# Patient Record
Sex: Female | Born: 1985 | Race: White | Hispanic: No | Marital: Married | Smoking: Former smoker
Health system: Southern US, Community
[De-identification: ages and names within clinical notes are randomized; demographics above are authoritative.]

## PROBLEM LIST (undated history)

## (undated) DIAGNOSIS — Z789 Other specified health status: Secondary | ICD-10-CM

## (undated) DIAGNOSIS — N3010 Interstitial cystitis (chronic) without hematuria: Secondary | ICD-10-CM

## (undated) DIAGNOSIS — I7300 Raynaud's syndrome without gangrene: Secondary | ICD-10-CM

## (undated) DIAGNOSIS — O4693 Antepartum hemorrhage, unspecified, third trimester: Secondary | ICD-10-CM

## (undated) DIAGNOSIS — S83249A Other tear of medial meniscus, current injury, unspecified knee, initial encounter: Secondary | ICD-10-CM

## (undated) DIAGNOSIS — Z98811 Dental restoration status: Secondary | ICD-10-CM

## (undated) HISTORY — DX: Raynaud's syndrome without gangrene: I73.00

## (undated) HISTORY — PX: INGUINAL HERNIA REPAIR: SHX194

## (undated) HISTORY — PX: ARTHROSCOPIC REPAIR ACL: SUR80

## (undated) HISTORY — PX: WISDOM TOOTH EXTRACTION: SHX21

## (undated) HISTORY — PX: BREAST ENHANCEMENT SURGERY: SHX7

---

## 2004-12-05 ENCOUNTER — Other Ambulatory Visit: Admission: RE | Admit: 2004-12-05 | Discharge: 2004-12-05 | Payer: Self-pay | Admitting: Obstetrics and Gynecology

## 2005-03-19 ENCOUNTER — Emergency Department (HOSPITAL_COMMUNITY): Admission: EM | Admit: 2005-03-19 | Discharge: 2005-03-19 | Payer: Self-pay | Admitting: Emergency Medicine

## 2005-04-22 ENCOUNTER — Other Ambulatory Visit: Admission: RE | Admit: 2005-04-22 | Discharge: 2005-04-22 | Payer: Self-pay | Admitting: Obstetrics and Gynecology

## 2008-05-31 ENCOUNTER — Ambulatory Visit: Payer: Self-pay | Admitting: Vascular Surgery

## 2008-07-19 ENCOUNTER — Ambulatory Visit: Payer: Self-pay | Admitting: Vascular Surgery

## 2009-06-13 ENCOUNTER — Ambulatory Visit: Payer: Self-pay | Admitting: Vascular Surgery

## 2010-04-03 ENCOUNTER — Ambulatory Visit
Admission: RE | Admit: 2010-04-03 | Discharge: 2010-04-03 | Payer: Self-pay | Source: Home / Self Care | Attending: Vascular Surgery | Admitting: Vascular Surgery

## 2010-05-15 ENCOUNTER — Ambulatory Visit (HOSPITAL_BASED_OUTPATIENT_CLINIC_OR_DEPARTMENT_OTHER)
Admission: RE | Admit: 2010-05-15 | Discharge: 2010-05-15 | Disposition: A | Discharge status: Home / Self Care | Payer: 59 | Attending: Urology | Admitting: Urology

## 2010-05-15 DIAGNOSIS — Z01812 Encounter for preprocedural laboratory examination: Secondary | ICD-10-CM | POA: Insufficient documentation

## 2010-05-15 DIAGNOSIS — N3010 Interstitial cystitis (chronic) without hematuria: Secondary | ICD-10-CM | POA: Insufficient documentation

## 2010-05-15 HISTORY — PX: CYSTOSCOPY WITH HYDRODISTENSION AND BIOPSY: SHX5127

## 2010-05-15 LAB — POCT PREGNANCY, URINE: Preg Test, Ur: NEGATIVE

## 2010-06-05 NOTE — Op Note (Signed)
  NAME:  Peggy Sanchez, Peggy Sanchez              ACCOUNT NO.:  [id number]  MEDICAL RECORD NO.:  [id number]          PATIENT TYPE:  AMB  LOCATION:  [facility]                         FACILITY:  [facility]  PHYSICIAN:  Martina Sinner, MD DATE OF BIRTH:  Nov 08, 1985  DATE OF PROCEDURE:  05/15/2010 DATE OF DISCHARGE:                              OPERATIVE REPORT   PREOPERATIVE DIAGNOSIS:  Pelvic pain.  POSTOPERATIVE DIAGNOSIS:  Interstitial cystitis.  PROCEDURE:  Cystoscopy, bladder hydrodistention, bladder installation therapy.  PROCEDURE IN DETAIL:  The patient was prepped and draped in the usual fashion.  A 21-French scope was utilized.  Bladder mucosa and trigone were normal.  There was no stitch, foreign body or carcinoma.  She was hydrodistended to 800 mL.  The bladder was emptied.  On reinspection she had mild diffuse glomerulations, especially at 5 o'clock and 7 o'clock. The bladder was emptied.  As a separate procedure, I instilled 15 cc of 0.5% Marcaine plus 400 mg of Pyridium.  The patient was taken to the recovery room.          ______________________________ Martina Sinner, MD     SAM/MEDQ  D:  05/15/2010  T:  05/15/2010  Job:  [id number]  Electronically Signed by Alfredo Martinez MD on 06/05/2010 12:49:34 PM

## 2010-06-12 ENCOUNTER — Ambulatory Visit (INDEPENDENT_AMBULATORY_CARE_PROVIDER_SITE_OTHER): Payer: Self-pay

## 2010-06-12 DIAGNOSIS — I83893 Varicose veins of bilateral lower extremities with other complications: Secondary | ICD-10-CM

## 2010-07-10 ENCOUNTER — Ambulatory Visit: Payer: 59

## 2010-12-16 ENCOUNTER — Encounter: Payer: Self-pay | Admitting: *Deleted

## 2010-12-24 ENCOUNTER — Ambulatory Visit (INDEPENDENT_AMBULATORY_CARE_PROVIDER_SITE_OTHER): Payer: 59 | Admitting: *Deleted

## 2010-12-24 DIAGNOSIS — I781 Nevus, non-neoplastic: Secondary | ICD-10-CM

## 2010-12-24 NOTE — Progress Notes (Signed)
Subjective:     Patient ID: Peggy Sanchez, female   DOB: September 24, 1985, 25 y.o.   MRN: [id number]  HPI X=.3% Sotradecol administered with a 27g butterfly.  Patient received a total of 3cc foam.       Photos: yes, archived  Compression stockings applied: yes    Review of Systems     Objective:   Physical Exam     Assessment:     Her spider veins are TINY but I was able to get into them. Did not have to use an entire syringe. She tolerated the tx very well    Plan:     Follow prn.

## 2011-02-25 ENCOUNTER — Ambulatory Visit (INDEPENDENT_AMBULATORY_CARE_PROVIDER_SITE_OTHER): Payer: 59 | Admitting: *Deleted

## 2011-02-25 DIAGNOSIS — I781 Nevus, non-neoplastic: Secondary | ICD-10-CM

## 2011-03-02 ENCOUNTER — Encounter: Payer: Self-pay | Admitting: Vascular Surgery

## 2011-10-23 LAB — OB RESULTS CONSOLE HIV ANTIBODY (ROUTINE TESTING): HIV: NONREACTIVE

## 2011-10-23 LAB — OB RESULTS CONSOLE ABO/RH: RH Type: NEGATIVE

## 2011-10-23 LAB — OB RESULTS CONSOLE HEPATITIS B SURFACE ANTIGEN: Hepatitis B Surface Ag: NEGATIVE

## 2011-10-23 LAB — OB RESULTS CONSOLE RUBELLA ANTIBODY, IGM: Rubella: IMMUNE

## 2011-10-23 LAB — OB RESULTS CONSOLE RPR: RPR: NONREACTIVE

## 2011-11-05 LAB — OB RESULTS CONSOLE GC/CHLAMYDIA
Chlamydia: NEGATIVE
Gonorrhea: NEGATIVE

## 2012-03-23 NOTE — L&D Delivery Note (Signed)
Delivery Note At 9:57 AM a viable female was delivered via Vaginal, Spontaneous Delivery (Presentation: Left Occiput Anterior).  APGAR: 9, 9; weight  Loose Nuchal Cord x 1 reduced prior to delivery of shoulders and body.  Placenta status: Intact, Spontaneous.  Cord pH: No collected. Cord blood as other Rh neg status. Delivery with out complications.  Anesthesia: Epidural  Episiotomy: None Lacerations: Labial;Vaginal;1st degree, Minor laceration - repair for good alignment Suture Repair: 4.0 vicryl Est. Blood Loss (mL): 300  Mom to postpartum.  Baby to nursery-stable.  DAVIES, DENISE 05/26/2012, 10:44 AM

## 2012-05-26 ENCOUNTER — Encounter (HOSPITAL_COMMUNITY): Payer: Self-pay | Admitting: *Deleted

## 2012-05-26 ENCOUNTER — Inpatient Hospital Stay (HOSPITAL_COMMUNITY): Payer: BC Managed Care – PPO | Admitting: Anesthesiology

## 2012-05-26 ENCOUNTER — Encounter (HOSPITAL_COMMUNITY): Payer: Self-pay | Admitting: Anesthesiology

## 2012-05-26 ENCOUNTER — Inpatient Hospital Stay (HOSPITAL_COMMUNITY)
Admission: AD | Admit: 2012-05-26 | Discharge: 2012-05-28 | DRG: 373 | Disposition: A | Discharge status: Home / Self Care | Payer: BC Managed Care – PPO | Source: Ambulatory Visit | Attending: Obstetrics and Gynecology | Admitting: Obstetrics and Gynecology

## 2012-05-26 LAB — CBC
HCT: 38.9 % (ref 36.0–46.0)
Hemoglobin: 13.5 g/dL (ref 12.0–15.0)
MCH: 31.6 pg (ref 26.0–34.0)
MCHC: 34.7 g/dL (ref 30.0–36.0)
MCV: 91.1 fL (ref 78.0–100.0)
Platelets: 230 10*3/uL (ref 150–400)
RBC: 4.27 MIL/uL (ref 3.87–5.11)
RDW: 12.9 % (ref 11.5–15.5)
WBC: 12.5 10*3/uL — ABNORMAL HIGH (ref 4.0–10.5)

## 2012-05-26 LAB — RPR: RPR Ser Ql: NONREACTIVE

## 2012-05-26 LAB — AMNISURE RUPTURE OF MEMBRANE (ROM) NOT AT ARMC: Amnisure ROM: POSITIVE

## 2012-05-26 MED ORDER — PHENYLEPHRINE 40 MCG/ML (10ML) SYRINGE FOR IV PUSH (FOR BLOOD PRESSURE SUPPORT): 80.0000 ug | PREFILLED_SYRINGE | INTRAVENOUS | Status: DC | PRN

## 2012-05-26 MED ORDER — BENZOCAINE-MENTHOL 20-0.5 % EX AERO
1.0000 "application " | INHALATION_SPRAY | CUTANEOUS | Status: DC | PRN
  Administered 2012-05-26: 1 via TOPICAL
  Filled 2012-05-26: qty 56

## 2012-05-26 MED ORDER — DIBUCAINE 1 % RE OINT: 1.0000 "application " | TOPICAL_OINTMENT | RECTAL | Status: DC | PRN

## 2012-05-26 MED ORDER — LANOLIN HYDROUS EX OINT
TOPICAL_OINTMENT | CUTANEOUS | Status: DC | PRN
Start: 2012-05-26 — End: 2012-05-28

## 2012-05-26 MED ORDER — SIMETHICONE 80 MG PO CHEW: 80.0000 mg | CHEWABLE_TABLET | ORAL | Status: DC | PRN

## 2012-05-26 MED ORDER — LIDOCAINE HCL (PF) 1 % IJ SOLN
30.0000 mL | INTRAMUSCULAR | Status: DC | PRN
  Filled 2012-05-26: qty 30

## 2012-05-26 MED ORDER — LIDOCAINE HCL (PF) 1 % IJ SOLN
INTRAMUSCULAR | Status: DC | PRN
  Administered 2012-05-26 (×2): 4 mL

## 2012-05-26 MED ORDER — PRENATAL MULTIVITAMIN CH
1.0000 | ORAL_TABLET | Freq: Every day | ORAL | Status: DC
  Administered 2012-05-27 – 2012-05-28 (×2): 1 via ORAL
  Filled 2012-05-26 (×2): qty 1

## 2012-05-26 MED ORDER — OXYCODONE-ACETAMINOPHEN 5-325 MG PO TABS: 1.0000 | ORAL_TABLET | ORAL | Status: DC | PRN

## 2012-05-26 MED ORDER — ONDANSETRON HCL 4 MG PO TABS: 4.0000 mg | ORAL_TABLET | ORAL | Status: DC | PRN

## 2012-05-26 MED ORDER — ONDANSETRON HCL 4 MG/2ML IJ SOLN: 4.0000 mg | INTRAMUSCULAR | Status: DC | PRN

## 2012-05-26 MED ORDER — LACTATED RINGERS IV SOLN
INTRAVENOUS | Status: DC
  Administered 2012-05-26: 08:00:00 via INTRAVENOUS

## 2012-05-26 MED ORDER — WITCH HAZEL-GLYCERIN EX PADS
1.0000 "application " | MEDICATED_PAD | CUTANEOUS | Status: DC | PRN
  Administered 2012-05-26: 1 via TOPICAL

## 2012-05-26 MED ORDER — OXYTOCIN BOLUS FROM INFUSION
500.0000 mL | INTRAVENOUS | Status: DC
  Administered 2012-05-26: 500 mL via INTRAVENOUS

## 2012-05-26 MED ORDER — FLEET ENEMA 7-19 GM/118ML RE ENEM: 1.0000 | ENEMA | RECTAL | Status: DC | PRN

## 2012-05-26 MED ORDER — ACETAMINOPHEN 325 MG PO TABS: 650.0000 mg | ORAL_TABLET | ORAL | Status: DC | PRN

## 2012-05-26 MED ORDER — CITRIC ACID-SODIUM CITRATE 334-500 MG/5ML PO SOLN: 30.0000 mL | ORAL | Status: DC | PRN

## 2012-05-26 MED ORDER — EPHEDRINE 5 MG/ML INJ
10.0000 mg | INTRAVENOUS | Status: DC | PRN
  Filled 2012-05-26: qty 4

## 2012-05-26 MED ORDER — FENTANYL 2.5 MCG/ML BUPIVACAINE 1/10 % EPIDURAL INFUSION (WH - ANES)
INTRAMUSCULAR | Status: DC | PRN
  Administered 2012-05-26: 12 mL/h via EPIDURAL

## 2012-05-26 MED ORDER — TETANUS-DIPHTH-ACELL PERTUSSIS 5-2.5-18.5 LF-MCG/0.5 IM SUSP: 0.5000 mL | Freq: Once | INTRAMUSCULAR | Status: DC

## 2012-05-26 MED ORDER — IBUPROFEN 600 MG PO TABS
600.0000 mg | ORAL_TABLET | Freq: Four times a day (QID) | ORAL | Status: DC
  Administered 2012-05-26 – 2012-05-28 (×8): 600 mg via ORAL
  Filled 2012-05-26 (×8): qty 1

## 2012-05-26 MED ORDER — IBUPROFEN 600 MG PO TABS
600.0000 mg | ORAL_TABLET | Freq: Four times a day (QID) | ORAL | Status: DC | PRN
  Administered 2012-05-26: 600 mg via ORAL
  Filled 2012-05-26: qty 1

## 2012-05-26 MED ORDER — SENNOSIDES-DOCUSATE SODIUM 8.6-50 MG PO TABS
2.0000 | ORAL_TABLET | Freq: Every day | ORAL | Status: DC
  Administered 2012-05-26 – 2012-05-27 (×2): 2 via ORAL

## 2012-05-26 MED ORDER — ONDANSETRON HCL 4 MG/2ML IJ SOLN: 4.0000 mg | Freq: Four times a day (QID) | INTRAMUSCULAR | Status: DC | PRN

## 2012-05-26 MED ORDER — ZOLPIDEM TARTRATE 5 MG PO TABS: 5.0000 mg | ORAL_TABLET | Freq: Every evening | ORAL | Status: DC | PRN

## 2012-05-26 MED ORDER — DIPHENHYDRAMINE HCL 25 MG PO CAPS: 25.0000 mg | ORAL_CAPSULE | Freq: Four times a day (QID) | ORAL | Status: DC | PRN

## 2012-05-26 MED ORDER — LACTATED RINGERS IV SOLN: 500.0000 mL | INTRAVENOUS | Status: DC | PRN

## 2012-05-26 MED ORDER — PHENYLEPHRINE 40 MCG/ML (10ML) SYRINGE FOR IV PUSH (FOR BLOOD PRESSURE SUPPORT)
80.0000 ug | PREFILLED_SYRINGE | INTRAVENOUS | Status: DC | PRN
  Filled 2012-05-26: qty 5

## 2012-05-26 MED ORDER — DIPHENHYDRAMINE HCL 50 MG/ML IJ SOLN: 12.5000 mg | INTRAMUSCULAR | Status: DC | PRN

## 2012-05-26 MED ORDER — OXYTOCIN 40 UNITS IN LACTATED RINGERS INFUSION - SIMPLE MED
1.0000 m[IU]/min | INTRAVENOUS | Status: DC
  Administered 2012-05-26: 2 m[IU]/min via INTRAVENOUS
  Filled 2012-05-26: qty 1000

## 2012-05-26 MED ORDER — LACTATED RINGERS IV SOLN
500.0000 mL | Freq: Once | INTRAVENOUS | Status: AC
  Administered 2012-05-26: 500 mL via INTRAVENOUS

## 2012-05-26 MED ORDER — OXYTOCIN 40 UNITS IN LACTATED RINGERS INFUSION - SIMPLE MED: 62.5000 mL/h | INTRAVENOUS | Status: DC

## 2012-05-26 MED ORDER — FENTANYL 2.5 MCG/ML BUPIVACAINE 1/10 % EPIDURAL INFUSION (WH - ANES)
14.0000 mL/h | INTRAMUSCULAR | Status: DC
  Filled 2012-05-26: qty 125

## 2012-05-26 MED ORDER — EPHEDRINE 5 MG/ML INJ: 10.0000 mg | INTRAVENOUS | Status: DC | PRN

## 2012-05-26 NOTE — Progress Notes (Signed)
S:  Ctx getting stronger - all in her back  O:  VS: Blood pressure 118/79, pulse 51, temperature 98.3 F (36.8 C), temperature source Oral, resp. rate 18, height 5\' 5"  (1.651 m), weight 68.947 kg (152 lb), SpO2 100.00%.        FHR : baseline 145 / variability moderate / accels + / decels none        Toco: contractions every 1-3 minutes / moderate         Cervix : 3-4 / 90% / vtx / 0 station / LOP        Membranes: clear fluid leakage        IV attempt x 4 - placed without difficulty with swelling after fluid attached / multiple valves in veins          clammy skin, shaking and anxious (will have anesthesia come place IV)  A: latent labor with cervical progress     FHR category 1  P: admit - expectant management     place IV after shower     epidural at maternal request    Marlinda Mike [name], MSN 05/26/2012, 4:21 AM

## 2012-05-26 NOTE — Anesthesia Preprocedure Evaluation (Signed)
Anesthesia Evaluation  Patient identified by MRN, date of birth, ID band Patient awake    Reviewed: Allergy & Precautions, H&P , NPO status , Patient's Chart, lab work & pertinent test results  Airway Mallampati: II TM Distance: >3 FB Neck ROM: Full    Dental  (+) Teeth Intact and Dental Advisory Given   Pulmonary neg pulmonary ROS,  breath sounds clear to auscultation        Cardiovascular negative cardio ROS  Rhythm:Regular Rate:Normal     Neuro/Psych negative neurological ROS  negative psych ROS   GI/Hepatic negative GI ROS, Neg liver ROS,   Endo/Other  negative endocrine ROS  Renal/GU negative Renal ROS     Musculoskeletal negative musculoskeletal ROS (+)   Abdominal   Peds  Hematology negative hematology ROS (+)   Anesthesia Other Findings   Reproductive/Obstetrics (+) Pregnancy                           Anesthesia Physical Anesthesia Plan  ASA: II  Anesthesia Plan: Epidural   Post-op Pain Management:    Induction:   Airway Management Planned:   Additional Equipment:   Intra-op Plan:   Post-operative Plan:   Informed Consent: I have reviewed the patients History and Physical, chart, labs and discussed the procedure including the risks, benefits and alternatives for the proposed anesthesia with the patient or authorized representative who has indicated his/her understanding and acceptance.     Plan Discussed with:   Anesthesia Plan Comments:         Anesthesia Quick Evaluation

## 2012-05-26 NOTE — Progress Notes (Signed)
Note for : 9.24 hrs S: Feeling pressure with each contraction. O SVE: 10/100%/+2 Vx on Perineum LOA    FHT;s baseline 135 bpm, intermittent variables noted     Contractions: 1: 2 mins and coupling of contractions has stopped.    Patient has desires to push. A: Fully dilated and ready to push. P: Commence pushing at 09.35 hrs.  Earl Gala, [name].

## 2012-05-26 NOTE — Progress Notes (Addendum)
Note for 08.25 hrs S: Patient is now feeling pressure with peak of each contraction. O: FHT's 135 bpm baseline with acceleration 15 x 15 mins     Continuous EFM.     Pitocin 2 milliunits/mins iv to augment labor pattern     Contractions 1: 1- 2 mins, coping well with contractions     Abdomen soft in between contractions.     Epidural working well. A: Progressing well in labor P: Continue care and expectant management for NSVD     Will re -evaluate  at 9.20 hrs  Venda Rodes, [name].

## 2012-05-26 NOTE — Progress Notes (Signed)
Note: 9.45hrs S: Patient pushing effectively and Vx descending with each push. O: EFM baseline 135 bpm  With intermittent variable due to head compression and possible cord compression.      Spontaneous recovery to baseline. A: Pushing well  P: Expectant management for NSVD in next 10 - 15 mins

## 2012-05-26 NOTE — Progress Notes (Signed)
S:  Comfortable with epidural  O:  VS: Blood pressure 110/66, pulse 63, temperature 98.7 F (37.1 C), temperature source Oral, resp. rate 16, height 5\' 5"  (1.651 m), weight 68.947 kg (152 lb), SpO2 99.00%.        FHR : baseline 145 / variability moderate / accels + intermittently / decels none        Toco: contractions every 3 minutes / moderate to strong        Cervix : 3-4 / 90% / vtx / 0 / LOP with caput        Membranes: clear fluid with small show        Foley placed - draining clear yellow urine  A: active labor     FHR category 1  P: repositioned for fetal rotation - lateral positioning      Recheck in 2-3 hours - IUPC and pitocin augment if no progression   Marlinda Mike [name], MSN 05/26/2012, 6:09 AM

## 2012-05-26 NOTE — MAU Note (Signed)
Wiliam Ke [name] notified of pt SVE, amnisure result.  Orders rec'd.

## 2012-05-26 NOTE — H&P (Signed)
  OB ADMISSION/ HISTORY & PHYSICAL:  Admission Date: 05/26/2012  1:29 AM  Admit Diagnosis: SROM at term / latent labor  Peggy Sanchez is a 27 y.o. female presenting for SROM at 2330 with onset of mild ctx.  Prenatal History: G1P0   EDC : 06/09/2012, Alternate EDD Entry  Prenatal care at [facility] Provider: Marlinda Mike [name] Prenatal course uncomplicated  Prenatal Labs: ABO, Rh: A (08/02 0000) negative Antibody: Negative (08/02 0000) Rubella: Immune (08/02 0000)  RPR: Nonreactive (08/02 0000)  HBsAg: Negative (08/02 0000)  HIV: Non-reactive (08/02 0000)  GBS:   negative 1 hr Glucola : NL  Medical / Surgical History :  Past medical history:  Past Medical History  Diagnosis Date  . Medical history non-contributory      Past surgical history:  Past Surgical History  Procedure Laterality Date  . Hernia repair    . Breast surgery      Family History: History reviewed. No pertinent family history.   Social History:  reports that she has quit smoking. She does not have any smokeless tobacco history on file. She reports that she does not drink alcohol or use illicit drugs.  Allergies: Review of patient's allergies indicates no known allergies.   Current Medications at time of admission:  Prenatal vitamin daily  Review of Systems: SROM - clear fluid with intermittent leakage since 2330 Mild ctx Active FM No bleeding  Physical Exam:  VS: Blood pressure 118/79, pulse 51, temperature 98.3 F (36.8 C), temperature source Oral, resp. rate 18, height 5\' 5"  (1.651 m), weight 68.947 kg (152 lb), SpO2 100.00%.  General: alert and oriented, appears comfortable Heart: RRR Lungs: Clear lung fields Abdomen: Gravid, soft and non-tender, non-distended / uterus: gravid and non-tender Extremities: trace edema  Genitalia / VE: Dilation: 1.5 Effacement (%): 90 Station: -2 Exam by:: Rudi Coco RN  FHR: baseline rate  / variability 140 / accels + /  decels none TOCO: 1-3 minutes mild  Assessment: [redacted] weeks gestation latent stage of labor FHR category 1   Plan:  Admit  Dr Billy Coast notified of admission / plan of care   Marlinda Mike [name], MSN 05/26/2012, 3:40 AM

## 2012-05-26 NOTE — MAU Note (Signed)
Pt up to bathroom.

## 2012-05-26 NOTE — Anesthesia Procedure Notes (Signed)
Epidural Patient location during procedure: OB Start time: 05/26/2012 5:10 AM End time: 05/26/2012 5:25 AM  Staffing Anesthesiologist: Lewie Loron R Performed by: anesthesiologist   Preanesthetic Checklist Completed: patient identified, pre-op evaluation, timeout performed, IV checked, risks and benefits discussed and monitors and equipment checked  Epidural Patient position: sitting Prep: DuraPrep Patient monitoring: heart rate, continuous pulse ox and blood pressure Injection technique: LOR air  Needle:  Needle type: Tuohy  Needle gauge: 17 G Needle length: 9 cm Needle insertion depth: 6 cm Catheter type: closed end flexible Catheter size: 19 Gauge Catheter at skin depth: 11 cm Test dose: negative  Assessment Sensory level: T8 Events: blood not aspirated, injection not painful, no injection resistance, negative IV test and no paresthesia

## 2012-05-26 NOTE — MAU Note (Signed)
Wiliam Ke [name] notified of pt.  Order rec'd.

## 2012-05-26 NOTE — MAU Note (Signed)
Pt presents with ROM at 2330  Some contractions

## 2012-05-26 NOTE — Progress Notes (Signed)
S:  No pain or pressure  O:  VS: Blood pressure 98/59, pulse 62, temperature 98.3 F (36.8 C), temperature source Oral, resp. rate 16, height 5\' 5"  (1.651 m), weight 68.947 kg (152 lb), SpO2 99.00%.        FHR : baseline 135 / variability moderate / accels + / decels none        Toco: contractions every 2-6 minutes with coupling / moderate         Cervix : 4-5cm / 90% / vtx / LOP  with caput         Membranes: clear with show  A: protracted labor - LOP     FHR category 1  P: pitocin augmentation     exaggerated Sims /  lateral positioning for fetal rotation       Marlinda Mike [name], MSN 05/26/2012, 7:37 AM

## 2012-05-27 LAB — CBC
HCT: 36.8 % (ref 36.0–46.0)
MCH: 31.3 pg (ref 26.0–34.0)
MCHC: 34 g/dL (ref 30.0–36.0)
MCV: 92 fL (ref 78.0–100.0)
Platelets: 179 10*3/uL (ref 150–400)
RDW: 13.1 % (ref 11.5–15.5)
WBC: 12.5 10*3/uL — ABNORMAL HIGH (ref 4.0–10.5)

## 2012-05-27 NOTE — Anesthesia Postprocedure Evaluation (Signed)
  Anesthesia Post-op Note  Patient: Peggy Sanchez  Procedure(s) Performed: * No procedures listed *  Patient Location: Mother/Baby  Anesthesia Type:Epidural  Level of Consciousness: awake, alert  and oriented  Airway and Oxygen Therapy: Patient Spontanous Breathing  Post-op Pain: mild  Post-op Assessment: Patient's Cardiovascular Status Stable and Respiratory Function Stable  Post-op Vital Signs: stable  Complications: No apparent anesthesia complications

## 2012-05-27 NOTE — Progress Notes (Signed)
Patient ID: Peggy Sanchez, female   DOB: [birth year], 27 y.o.   MRN: [id number] PPD # 1  Subjective: Pt reports feeling well/ Pain controlled with ibuprofen Tolerating po/ Voiding without problems/ No n/v Bleeding is light Newborn info:  Information for the patient's newborn:  Beatric, Fulop [[id number]]  female  / circ completed this am per Dr Billy Coast Feeding: breast   Objective:  VS: Blood pressure 99/66, pulse 63, temperature 97.7 F (36.5 C), temperature source Oral, resp. rate 18.    Recent Labs  05/26/12 0350 05/27/12 0540  WBC 12.5* 12.5*  HGB 13.5 12.5  HCT 38.9 36.8  PLT 230 179    Blood type: --/--/A NEG (03/06 0350)   (Infant Rh Neg) Rubella: Immune (08/02 0000)    Physical Exam:  General:  alert, cooperative and no distress CV: Regular rate and rhythm Resp: clear Abdomen: soft, nontender, normal bowel sounds Uterine Fundus: firm, below umbilicus, nontender Perineum: healing with good reapproximation and small pink hemorrhoid noted Lochia: minimal Ext: Homans sign is negative, no sign of DVT and no edema, redness or tenderness in the calves or thighs   A/P: PPD # 1/ G1P1001/ S/P: SVD with 1st deg lac with repair Doing well Continue routine post partum orders Anticipate D/C home in AM    [facility] K, MSN, [facility] 05/27/2012, 11:08 AM

## 2012-05-28 MED ORDER — OXYCODONE-ACETAMINOPHEN 5-325 MG PO TABS: 1.0000 | ORAL_TABLET | ORAL | Status: DC | PRN

## 2012-05-28 MED ORDER — HYDROCORTISONE 2.5 % RE CREA: TOPICAL_CREAM | Freq: Two times a day (BID) | RECTAL | Status: DC

## 2012-05-28 MED ORDER — IBUPROFEN 600 MG PO TABS: 600.0000 mg | ORAL_TABLET | Freq: Four times a day (QID) | ORAL | Status: DC

## 2012-05-28 NOTE — Progress Notes (Signed)
PPD 2 SVD  S:  Reports feeling well             Tolerating po/ No nausea or vomiting             Bleeding is light             Pain controlled with motrin and occasional percocet             Up ad lib / ambulatory / voiding QS  Newborn breast feeding well    O:               VS: BP 114/74  Pulse 55  Temp(Src) 97.9 F (36.6 C) (Oral)  Resp 18  Ht 5\' 5"  (1.651 m)  Wt 68.947 kg (152 lb)  BMI 25.29 kg/m2  SpO2 98%   LABS:  Recent Labs  05/26/12 0350 05/27/12 0540  WBC 12.5* 12.5*  HGB 13.5 12.5  PLT 230 179                                         I&O: Intake/Output     03/07 0701 - 03/08 0700 03/08 0701 - 03/09 0700   Urine (mL/kg/hr)     Blood     Total Output       Net                          Physical Exam:             Alert and oriented X3  Lungs: Clear and unlabored  Heart: regular rate and rhythm / no mumurs  Abdomen: soft, non-tender, non-distended              Fundus: firm, non-tender, U-1  Perineum: mild edema  Lochia: light  Extremities: no edema, no calf pain or tenderness    A: PPD # 2   Doing well - stable status  P:  Routine post partum orders  Discharge to home             WOB booklet - instructions reviewed  Marlinda Mike [name], MSN 05/28/2012, 10:25 AM

## 2012-05-28 NOTE — Discharge Summary (Signed)
Obstetric Discharge Summary  Reason for Admission: onset of labor Prenatal Procedures: none Intrapartum Procedures: spontaneous vaginal delivery and epidural Postpartum Procedures: none Complications-Operative and Postpartum: 1st degree perineal laceration Hemoglobin  Date Value Range Status  05/27/2012 12.5  12.0 - 15.0 g/dL Final     HCT  Date Value Range Status  05/27/2012 36.8  36.0 - 46.0 % Final    Physical Exam:  General: alert, cooperative and no distress Lochia: appropriate Uterine Fundus: firm Incision: healing well DVT Evaluation: No evidence of DVT seen on physical exam.  Discharge Diagnoses: Term Pregnancy-delivered  Discharge Information: Date: 05/28/2012 Activity: pelvic rest Diet: routine Medications: PNV, Ibuprofen, Percocet and proctocream Hc 2.5% Condition: stable Instructions: refer to practice specific booklet Discharge to: home Follow-up Information   Follow up with Marlinda Mike, [name]. Schedule an appointment as soon as possible for a visit in 6 weeks.   Contact information:   [address] [place] [address] [phone]       Newborn Data: Live born female  Birth Weight: 6 lb 3.1 oz (2810 g) APGAR: 9, 9  Home with mother.  Marlinda Mike 05/28/2012, 10:30 AM

## 2012-06-06 NOTE — Addendum Note (Signed)
Addendum created 06/06/12 1610 by Gaylan Gerold, MD   Modules edited: Orders

## 2012-06-16 ENCOUNTER — Ambulatory Visit (HOSPITAL_COMMUNITY): Payer: 59

## 2012-06-16 ENCOUNTER — Ambulatory Visit (HOSPITAL_COMMUNITY)
Admission: RE | Admit: 2012-06-16 | Discharge: 2012-06-16 | Disposition: A | Discharge status: Home / Self Care | Payer: BC Managed Care – PPO | Source: Ambulatory Visit | Attending: Obstetrics and Gynecology | Admitting: Obstetrics and Gynecology

## 2012-06-16 NOTE — Lactation Note (Addendum)
Infant Lactation Consultation Outpatient Visit Note  Patient Name: Peggy Sanchez   Baby name: Peggy Sanchez Date of Birth: 1985-08-02             DOB: [birth year] Birth Weight:  6 lbs 3 oz            Today's weight:  7 lbs. 36.45 oz  (91 days old)     Gestational Age at Delivery: 38 weeks Type of Delivery: vaginal  Breastfeeding History Frequency of Breastfeeding: every 2-3 hrs Length of Feeding: 5-15 mins Voids: QS Stools: QS  Supplementing / Method: Supplementing Bottles: 1-2 oz formula or expressed breast milk Pumping:  Type of Pump: Medela Pump in Style   Frequency: every 2 hours  Volume:  1/2-1 oz.  Comments:  Pertinent history- Mom (para 1) used Clomid to conceive (tried 9 months before being given Clomid), has breast implants (under muscle, no areolar incisions), reports very small (AAA cup) breasts prior to surgery, family history (mother) had low milk supply.  Peggy Sanchez was jaundiced (bili 16), requiring 5 days of phototherapy at home.  No pumping done at that time, even though Mom reports baby very sleepy.  Baby was 6 lbs 2.5 oz (0.5oz below BW) at 2 weeks checkup.  Pediatrician instructed Mom to begin supplementing baby with expressed breast milk+/formula 1-2 oz after every breast feeding.  Mom began double pumping regularly, obtaining 1 oz in place of breast feeding, 1/2 oz after breast feeding.  Mom taking Mother's Milk Tea, and fenugreek.  This morning, baby received 2 oz formula 3 hrs prior, and 1 oz breast milk 1 hr prior to appointment, both by bottle (Dr. Theora Gianotti bottle)  Mom easily latched baby to left breast in cross cradle hold.  Baby tucks lower lip in, and a little shallow.  Instructed Mom to re latch, making sure lower lip lower on areola.  Baby latched deeply and comfortably the 2nd time.  Baby fed with 1:1 suck swallow for 4 mins before becoming non-nutritive.  Assisted Mom to use breast compression to increase milk transfer.  Baby fed a total of 10 mins before switching  onto right breast.  Baby repeated same pattern on second breast, feeding well initially, but then tiring.   Consultation Evaluation:  Initial Feeding Assessment: Pre-feed Weight: 3240 Post-feed Weight: 3262 Amount Transferred: 22 ml Comments: both breasts with a deep latch, needing frequent stimulation to remain nutritive  Additional Feeding Assessment: Pre-feed Weight: 3262 Post-feed Weight: 3296 Amount Transferred: 34 ml Comments: SNS used with 30 ml for formula, baby latched on deeply, fed with vigorous suck/swallow pattern 1:1 for 15 minutes.   Total Breast milk Transferred this Visit:  26 ml Total Supplement Given: 30 ml, total feeding 56 ml  GOAL- increase milk supply/increase nutritive breast feeding/decrease bottle feeding   Additional Interventions:  Discussed with Peggy Sanchez using the SNS at the breast, to eliminate bottles.  The more baby breast feeds nutritively, the better chance she has to stimulate her milk supply.  Peggy Sanchez shown how to use the SNS at the breast effectively, how to put together and clean it properly.  Plan: 1- Breast feed Peggy Sanchez on cue (8-12 times in 24 hrs) 2-Use 45 ml EBM+/formula (liquid) in SNS on 2nd breast.  Use a good deep latch, and breast compression 3- Pump both breasts 15-20 mins after breast feeding, as often as you can (rest is important) 4- Herb/supplements for stimulating milk supply discussed  Follow-Up  Monday, March 31st @ 9 am    Katrinka Blazing,  [place] Lore 06/16/2012, 9:56 AM

## 2012-06-18 ENCOUNTER — Telehealth (HOSPITAL_COMMUNITY): Payer: Self-pay | Admitting: Lactation Services

## 2012-06-18 NOTE — Telephone Encounter (Signed)
Opened in errror 

## 2012-06-20 ENCOUNTER — Ambulatory Visit (HOSPITAL_COMMUNITY): Admission: RE | Admit: 2012-06-20 | Payer: 59 | Source: Ambulatory Visit

## 2012-06-24 ENCOUNTER — Ambulatory Visit (HOSPITAL_COMMUNITY): Admission: RE | Admit: 2012-06-24 | Payer: 59 | Source: Ambulatory Visit

## 2012-08-17 ENCOUNTER — Ambulatory Visit (INDEPENDENT_AMBULATORY_CARE_PROVIDER_SITE_OTHER): Payer: 59 | Admitting: *Deleted

## 2012-08-17 DIAGNOSIS — I781 Nevus, non-neoplastic: Secondary | ICD-10-CM

## 2012-08-17 NOTE — Progress Notes (Signed)
X=.3% Sotradecol administered with a 27g butterfly.  Patient received a total of 4cc. Injected all the areas she was concerned about. Veins were tiny. Hoping they close. Tol well. Follow prn.  Photos: yes  Compression stockings applied: yes

## 2012-08-18 ENCOUNTER — Encounter: Payer: Self-pay | Admitting: Vascular Surgery

## 2013-02-08 LAB — OB RESULTS CONSOLE ANTIBODY SCREEN: Antibody Screen: NEGATIVE

## 2013-02-08 LAB — OB RESULTS CONSOLE RPR: RPR: NONREACTIVE

## 2013-02-08 LAB — OB RESULTS CONSOLE HEPATITIS B SURFACE ANTIGEN: Hepatitis B Surface Ag: NEGATIVE

## 2013-02-08 LAB — OB RESULTS CONSOLE RUBELLA ANTIBODY, IGM: Rubella: IMMUNE

## 2013-02-08 LAB — OB RESULTS CONSOLE ABO/RH: RH Type: NEGATIVE

## 2013-02-08 LAB — OB RESULTS CONSOLE HIV ANTIBODY (ROUTINE TESTING): HIV: NONREACTIVE

## 2013-03-08 LAB — OB RESULTS CONSOLE GC/CHLAMYDIA
CHLAMYDIA, DNA PROBE: NEGATIVE
GC PROBE AMP, GENITAL: NEGATIVE

## 2013-03-08 LAB — OB RESULTS CONSOLE GBS: STREP GROUP B AG: POSITIVE

## 2013-03-23 NOTE — L&D Delivery Note (Signed)
Delivery Note  First Stage: Labor onset: 0230 Augmentation : none Analgesia /Anesthesia intrapartum: epidural SROM at 0505  Second Stage: Complete dilation at 1012 Onset of pushing at 1012 FHR second stage 90-120 bpm, variables  Delivery of a viable female at [id number] by [name] in OA position No nuchal cord Cord double clamped after cessation of pulsation, cut by FOB Cord blood sample collected   Collection of cord blood donation n/a Arterial cord blood sample n/a  Third Stage: Placenta delivered via Tomasa BlaseSchultz intact with 3 VC @ 1045 Placenta disposition: routine disposal Uterine tone firm / bleeding small  1st degree laceration identified  Anesthesia for repair: regional Repaired with 2-0 Vicryl rapide Est. Blood Loss (mL): 250  Complications: none  Mom to postpartum.  Baby to Couplet care / Skin to Skin.  Newborn: Birth Weight: 7-9  Apgar Scores: 9/9 Feeding planned: breast  Peggy LarryBHAMBRI, Peggy Sanchez, [name], [name] 09/30/2013, 11:01 AM

## 2013-09-19 ENCOUNTER — Encounter (HOSPITAL_COMMUNITY): Payer: Self-pay | Admitting: *Deleted

## 2013-09-19 ENCOUNTER — Inpatient Hospital Stay (HOSPITAL_COMMUNITY)
Admission: AD | Admit: 2013-09-19 | Discharge: 2013-09-19 | Disposition: A | Discharge status: Home / Self Care | Payer: BC Managed Care – PPO | Source: Ambulatory Visit | Attending: Obstetrics and Gynecology | Admitting: Obstetrics and Gynecology

## 2013-09-19 DIAGNOSIS — O479 False labor, unspecified: Secondary | ICD-10-CM | POA: Insufficient documentation

## 2013-09-19 DIAGNOSIS — Z87891 Personal history of nicotine dependence: Secondary | ICD-10-CM | POA: Insufficient documentation

## 2013-09-19 NOTE — MAU Provider Note (Signed)
  History     CSN: [id number]  Arrival date and time: 09/19/13 2023   First Provider Initiated Contact with Patient 09/19/13 2041      Chief Complaint  Patient presents with  . Labor Eval   HPI  diarrhea all day  ctx and cramping all day - stronger and closer x 2 hours No bleeding  No LOF  Past Medical History  Diagnosis Date  . Medical history non-contributory     Past Surgical History  Procedure Laterality Date  . Hernia repair    . Breast surgery      History reviewed. No pertinent family history.  History  Substance Use Topics  . Smoking status: Former Games developermoker  . Smokeless tobacco: Not on file  . Alcohol Use: No    Allergies: No Known Allergies  Prescriptions prior to admission  Medication Sig Dispense Refill  . docusate sodium (COLACE) 100 MG capsule Take 100 mg by mouth daily.      . hydrocortisone (PROCTOCREAM-HC) 2.5 % rectal cream Place rectally 2 (two) times daily.  30 g  1  . ibuprofen (ADVIL,MOTRIN) 600 MG tablet Take 1 tablet (600 mg total) by mouth every 6 (six) hours.  30 tablet  0  . oxyCODONE-acetaminophen (PERCOCET/ROXICET) 5-325 MG per tablet Take 1 tablet by mouth every 4 (four) hours as needed.  15 tablet  0  . Prenatal Vit-Fe Fumarate-FA (MULTIVITAMIN-PRENATAL) 27-0.8 MG TABS Take 1 tablet by mouth daily at 12 noon.      . ranitidine (ZANTAC) 75 MG tablet Take 75 mg by mouth 2 (two) times daily. Used for indigestion.        ROS Physical Exam   Blood pressure 105/69, pulse 163, temperature 98.2 F (36.8 C), temperature source Oral, resp. rate 18, height 5\' 5"  (1.651 m), weight 65.137 kg (143 lb 9.6 oz), SpO2 92.00%, unknown if currently breastfeeding.  Physical Exam Alert and oriented Abdomen soft and non-tender VE: 3cm /posterior / 60% / vtx -2 MAU Course  Procedures NST reactive  Assessment and Plan  37.5 weeks - braxton-hicks ctx No evidence of active labor  1) dc home 2) labor precautions  [name], Jumaane Weatherford 09/19/2013, 8:54  PM

## 2013-09-19 NOTE — MAU Note (Signed)
Pt states she was in office today and was 3 cm dilated.  Pt states she has been having u/c's since office visit and they are becoming closer together.  Reports every 3-7 minutes.  Denies vaginal bleeding or ROM.   Good fetal movement.

## 2013-09-19 NOTE — Discharge Instructions (Signed)
Rest at home Call if ctx painful - unable to sleep / SROM / ctx with bloody discharge

## 2013-09-30 ENCOUNTER — Inpatient Hospital Stay (HOSPITAL_COMMUNITY): Payer: BC Managed Care – PPO | Admitting: Anesthesiology

## 2013-09-30 ENCOUNTER — Encounter (HOSPITAL_COMMUNITY): Payer: Self-pay | Admitting: *Deleted

## 2013-09-30 ENCOUNTER — Encounter (HOSPITAL_COMMUNITY): Payer: BC Managed Care – PPO | Admitting: Anesthesiology

## 2013-09-30 ENCOUNTER — Encounter (HOSPITAL_COMMUNITY): Payer: Self-pay | Admitting: Anesthesiology

## 2013-09-30 ENCOUNTER — Inpatient Hospital Stay (HOSPITAL_COMMUNITY)
Admission: AD | Admit: 2013-09-30 | Discharge: 2013-10-01 | DRG: 775 | Disposition: A | Discharge status: Home / Self Care | Payer: BC Managed Care – PPO | Source: Ambulatory Visit | Attending: Obstetrics | Admitting: Obstetrics

## 2013-09-30 DIAGNOSIS — K219 Gastro-esophageal reflux disease without esophagitis: Secondary | ICD-10-CM | POA: Diagnosis present

## 2013-09-30 DIAGNOSIS — IMO0001 Reserved for inherently not codable concepts without codable children: Secondary | ICD-10-CM

## 2013-09-30 DIAGNOSIS — Z87891 Personal history of nicotine dependence: Secondary | ICD-10-CM

## 2013-09-30 DIAGNOSIS — O9989 Other specified diseases and conditions complicating pregnancy, childbirth and the puerperium: Secondary | ICD-10-CM

## 2013-09-30 DIAGNOSIS — Z22330 Carrier of Group B streptococcus: Secondary | ICD-10-CM

## 2013-09-30 DIAGNOSIS — O99892 Other specified diseases and conditions complicating childbirth: Secondary | ICD-10-CM | POA: Diagnosis present

## 2013-09-30 LAB — RPR

## 2013-09-30 LAB — CBC
HEMATOCRIT: 36.1 % (ref 36.0–46.0)
Hemoglobin: 12.5 g/dL (ref 12.0–15.0)
MCH: 32.1 pg (ref 26.0–34.0)
MCHC: 34.6 g/dL (ref 30.0–36.0)
MCV: 92.8 fL (ref 78.0–100.0)
Platelets: 201 10*3/uL (ref 150–400)
RBC: 3.89 MIL/uL (ref 3.87–5.11)
RDW: 12.8 % (ref 11.5–15.5)
WBC: 12.7 10*3/uL — AB (ref 4.0–10.5)

## 2013-09-30 MED ORDER — HYDROCORTISONE 2.5 % RE CREA
TOPICAL_CREAM | Freq: Two times a day (BID) | RECTAL | Status: DC
  Administered 2013-09-30: 23:00:00 via RECTAL
  Filled 2013-09-30: qty 28.35

## 2013-09-30 MED ORDER — WITCH HAZEL-GLYCERIN EX PADS: MEDICATED_PAD | CUTANEOUS | Status: DC | PRN

## 2013-09-30 MED ORDER — OXYCODONE-ACETAMINOPHEN 5-325 MG PO TABS: 1.0000 | ORAL_TABLET | ORAL | Status: DC | PRN

## 2013-09-30 MED ORDER — SIMETHICONE 80 MG PO CHEW
80.0000 mg | CHEWABLE_TABLET | ORAL | Status: DC | PRN
Start: 2013-09-30 — End: 2013-10-01

## 2013-09-30 MED ORDER — TETANUS-DIPHTH-ACELL PERTUSSIS 5-2.5-18.5 LF-MCG/0.5 IM SUSP: 0.5000 mL | Freq: Once | INTRAMUSCULAR | Status: DC

## 2013-09-30 MED ORDER — FENTANYL 2.5 MCG/ML BUPIVACAINE 1/10 % EPIDURAL INFUSION (WH - ANES)
14.0000 mL/h | INTRAMUSCULAR | Status: DC | PRN
  Administered 2013-09-30: 14 mL/h via EPIDURAL

## 2013-09-30 MED ORDER — ONDANSETRON HCL 4 MG/2ML IJ SOLN: 4.0000 mg | Freq: Four times a day (QID) | INTRAMUSCULAR | Status: DC | PRN

## 2013-09-30 MED ORDER — SENNOSIDES-DOCUSATE SODIUM 8.6-50 MG PO TABS
2.0000 | ORAL_TABLET | ORAL | Status: DC
  Administered 2013-09-30: 2 via ORAL
  Filled 2013-09-30: qty 2

## 2013-09-30 MED ORDER — PRENATAL MULTIVITAMIN CH
1.0000 | ORAL_TABLET | Freq: Every day | ORAL | Status: DC
  Administered 2013-10-01: 1 via ORAL
  Filled 2013-09-30: qty 1

## 2013-09-30 MED ORDER — PHENYLEPHRINE 40 MCG/ML (10ML) SYRINGE FOR IV PUSH (FOR BLOOD PRESSURE SUPPORT)
PREFILLED_SYRINGE | INTRAVENOUS | Status: AC
  Filled 2013-09-30: qty 10

## 2013-09-30 MED ORDER — CITRIC ACID-SODIUM CITRATE 334-500 MG/5ML PO SOLN
30.0000 mL | ORAL | Status: DC | PRN
  Administered 2013-09-30: 30 mL via ORAL
  Filled 2013-09-30: qty 15

## 2013-09-30 MED ORDER — DIPHENHYDRAMINE HCL 50 MG/ML IJ SOLN: 12.5000 mg | INTRAMUSCULAR | Status: DC | PRN

## 2013-09-30 MED ORDER — ONDANSETRON HCL 4 MG/2ML IJ SOLN
4.0000 mg | INTRAMUSCULAR | Status: DC | PRN
Start: 2013-09-30 — End: 2013-10-01

## 2013-09-30 MED ORDER — ACETAMINOPHEN 325 MG PO TABS: 650.0000 mg | ORAL_TABLET | ORAL | Status: DC | PRN

## 2013-09-30 MED ORDER — PENICILLIN G POTASSIUM 5000000 UNITS IJ SOLR
5.0000 10*6.[IU] | Freq: Once | INTRAVENOUS | Status: AC
  Administered 2013-09-30: 5 10*6.[IU] via INTRAVENOUS
  Filled 2013-09-30: qty 5

## 2013-09-30 MED ORDER — FENTANYL 2.5 MCG/ML BUPIVACAINE 1/10 % EPIDURAL INFUSION (WH - ANES)
INTRAMUSCULAR | Status: AC
  Filled 2013-09-30: qty 125

## 2013-09-30 MED ORDER — LACTATED RINGERS IV SOLN: 500.0000 mL | INTRAVENOUS | Status: DC | PRN

## 2013-09-30 MED ORDER — DIPHENHYDRAMINE HCL 25 MG PO CAPS
25.0000 mg | ORAL_CAPSULE | Freq: Four times a day (QID) | ORAL | Status: DC | PRN
Start: 2013-09-30 — End: 2013-10-01

## 2013-09-30 MED ORDER — OXYTOCIN 40 UNITS IN LACTATED RINGERS INFUSION - SIMPLE MED
62.5000 mL/h | INTRAVENOUS | Status: DC
  Filled 2013-09-30: qty 1000

## 2013-09-30 MED ORDER — EPHEDRINE 5 MG/ML INJ
10.0000 mg | INTRAVENOUS | Status: DC | PRN
  Filled 2013-09-30: qty 2

## 2013-09-30 MED ORDER — PHENYLEPHRINE 40 MCG/ML (10ML) SYRINGE FOR IV PUSH (FOR BLOOD PRESSURE SUPPORT)
80.0000 ug | PREFILLED_SYRINGE | INTRAVENOUS | Status: DC | PRN
  Administered 2013-09-30: 200 ug via INTRAVENOUS
  Filled 2013-09-30: qty 2

## 2013-09-30 MED ORDER — LIDOCAINE HCL (PF) 1 % IJ SOLN
INTRAMUSCULAR | Status: DC | PRN
  Administered 2013-09-30 (×4): 4 mL

## 2013-09-30 MED ORDER — PENICILLIN G POTASSIUM 5000000 UNITS IJ SOLR
2.5000 10*6.[IU] | INTRAMUSCULAR | Status: DC
  Administered 2013-09-30: 2.5 10*6.[IU] via INTRAVENOUS
  Filled 2013-09-30 (×5): qty 2.5

## 2013-09-30 MED ORDER — LIDOCAINE HCL (PF) 1 % IJ SOLN
30.0000 mL | INTRAMUSCULAR | Status: DC | PRN
  Filled 2013-09-30: qty 30

## 2013-09-30 MED ORDER — LANOLIN HYDROUS EX OINT: TOPICAL_OINTMENT | CUTANEOUS | Status: DC | PRN

## 2013-09-30 MED ORDER — LACTATED RINGERS IV SOLN: 500.0000 mL | Freq: Once | INTRAVENOUS | Status: DC

## 2013-09-30 MED ORDER — ONDANSETRON HCL 4 MG PO TABS: 4.0000 mg | ORAL_TABLET | ORAL | Status: DC | PRN

## 2013-09-30 MED ORDER — PHENYLEPHRINE 40 MCG/ML (10ML) SYRINGE FOR IV PUSH (FOR BLOOD PRESSURE SUPPORT)
80.0000 ug | PREFILLED_SYRINGE | INTRAVENOUS | Status: DC | PRN
  Filled 2013-09-30: qty 2

## 2013-09-30 MED ORDER — BENZOCAINE-MENTHOL 20-0.5 % EX AERO
1.0000 "application " | INHALATION_SPRAY | CUTANEOUS | Status: DC | PRN
  Filled 2013-09-30: qty 56

## 2013-09-30 MED ORDER — IBUPROFEN 600 MG PO TABS
600.0000 mg | ORAL_TABLET | Freq: Four times a day (QID) | ORAL | Status: DC
  Administered 2013-09-30 – 2013-10-01 (×4): 600 mg via ORAL
  Filled 2013-09-30 (×4): qty 1

## 2013-09-30 MED ORDER — LACTATED RINGERS IV SOLN
INTRAVENOUS | Status: DC
  Administered 2013-09-30: 06:00:00 via INTRAVENOUS

## 2013-09-30 MED ORDER — IBUPROFEN 600 MG PO TABS
600.0000 mg | ORAL_TABLET | Freq: Four times a day (QID) | ORAL | Status: DC | PRN
  Administered 2013-09-30: 600 mg via ORAL
  Filled 2013-09-30: qty 1

## 2013-09-30 MED ORDER — OXYTOCIN BOLUS FROM INFUSION
500.0000 mL | INTRAVENOUS | Status: DC
  Administered 2013-09-30: 500 mL via INTRAVENOUS

## 2013-09-30 MED ORDER — FLEET ENEMA 7-19 GM/118ML RE ENEM: 1.0000 | ENEMA | RECTAL | Status: DC | PRN

## 2013-09-30 NOTE — Progress Notes (Signed)
Nursery rn at bedside

## 2013-09-30 NOTE — Lactation Note (Addendum)
This note was copied from the chart of Peggy Sanchez. Lactation Consultation Note  P2, Mother has inframammary breast augmentation behind muscle. States with her first son she had to stop breastfeeding at 12 weeks due to low milk supply. Her description indicates minimal glandular tissue.   She was pumping, used fenugreek, moranga and did not produce enough. Most volume she pumped was approx 45 cc. Encouraged her to start pumping to stimulate her milk supply and will follow up if he needs to be supplemented. Reviewed breast massage and hand expression.   Set up DEBP, reviewed milk storage, curved tip syringe, cup, and cleaning. Mom encouraged to feed baby 8-12 times/24 hours and with feeding cues.  Then post pump 4-6 times a day for 15-20 min and give back what is pumped to baby. Mom made aware of O/P services, breastfeeding support groups, community resources, and our phone # for post-discharge questions.  Mother states she would like an outpatient appointment.     Patient Name: Peggy Sanchez ZOXWR'UToday's Date: 09/30/2013 Reason for consult: Initial assessment   Maternal Data Infant to breast within first hour of birth: Yes Has patient been taught Hand Expression?: Yes Does the patient have breastfeeding experience prior to this delivery?: Yes  Feeding Feeding Type: Breast Fed Length of feed: 45 min  LATCH Score/Interventions                      Lactation Tools Discussed/Used Pump Review: Setup, frequency, and cleaning;Milk Storage Initiated by:: Dahlia Byesuth Tanaia Hawkey RN Date initiated:: 10/01/13   Consult Status Consult Status: Follow-up Date: 10/01/13 Follow-up type: In-patient    Dahlia ByesBerkelhammer, Naol Ontiveros [facility] 09/30/2013, 2:23 PM

## 2013-09-30 NOTE — Progress Notes (Signed)
Donette LarryMelanie Bhambri [name] notified of pt's admission and status. Aware of ctx pattern, sve, SROM with cl fld, pos GBS. [facility]

## 2013-09-30 NOTE — H&P (Signed)
  OB ADMISSION/ HISTORY & PHYSICAL:  Admission Date: 09/30/2013  4:34 AM  Admit Diagnosis: 39.[redacted] weeks gestation, active labor  Peggy Sanchez is a 28 y.o. female presenting for regular, painful ctx since 0200 with SROM after arrival to MAU.  Prenatal History: G2P1001   EDC:10/05/2013, by sono  [facility]  Primary Ob Provider: Marlinda Mikeanya Bailey, [name] Prenatal course complicated by GBS bacteriuria, S<D discrepancy with EFW 6-15 (56%), normal AFI @38  wks, and GERD.  Prenatal Labs: ABO, Rh: A (11/19 0000) NEG Antibody: Negative (11/19 0000) Rubella: Immune (11/19 0000)  RPR: Nonreactive (11/19 0000)  HBsAg: Negative (11/19 0000)  HIV: Non-reactive (11/19 0000)  GBS: Positive (12/17 0000)  1 hr GTT : 118  Medical / Surgical History :  Past medical history:  Past Medical History  Diagnosis Date  . Medical history non-contributory      Past surgical history:  Past Surgical History  Procedure Laterality Date  . Hernia repair    . Breast surgery      Family History: History reviewed. No pertinent family history.   Social History:  reports that she has quit smoking. She does not have any smokeless tobacco history on file. She reports that she does not drink alcohol or use illicit drugs.  Allergies: Review of patient's allergies indicates no known allergies.   Current Medications at time of admission:  Prior to Admission medications   Medication Sig Start Date End Date Taking? Authorizing Provider  magnesium 30 MG tablet Take 30 mg by mouth 2 (two) times daily.   Yes Historical Provider, MD  Prenatal Vit-Fe Fumarate-FA (MULTIVITAMIN-PRENATAL) 27-0.8 MG TABS Take 1 tablet by mouth daily at 12 noon.   Yes Historical Provider, MD  ranitidine (ZANTAC) 75 MG tablet Take 75 mg by mouth 2 (two) times daily. Used for indigestion.   Yes Historical Provider, MD  hydrocortisone (PROCTOCREAM-HC) 2.5 % rectal cream Place rectally 2 (two) times daily. 05/28/12    Marlinda Mikeanya Bailey, [name]     Review of Systems: +regular ctx +LOF +FM  Physical Exam:  VS: Blood pressure 96/61, pulse 61, temperature 98 F (36.7 C), temperature source Oral, resp. rate 18, height 5\' 5"  (1.651 m), weight 65.772 kg (145 lb), SpO2 100.00%, unknown if currently breastfeeding.  General: alert and oriented, appears comfortable  Heart: RRR Lungs: Clear lung fields Abdomen: Gravid, soft and non-tender, non-distended / uterus: gravid, non-tender Extremities: no edema  Genitalia / VE: 9/100/-1; EFW: 6#  FHR: baseline rate 130 / variability mod / accelerations + / no decelerations TOCO: 1-3, moderate  Assessment: 39.[redacted] weeks gestation First stage of labor, active FHR category I GBS carrier  Plan:  Admit, epidural anesthesia, GBS prophylaxis, anticipate SVD in 1-2 hrs. Dr. Ernestina PennaFogleman notified of admission / plan of care   [name], [name], [name], [name] 09/30/2013, 8:34 AM

## 2013-09-30 NOTE — MAU Note (Signed)
Contractions woke me up at 0245. Some bloody show but no lof

## 2013-09-30 NOTE — Anesthesia Preprocedure Evaluation (Signed)
Anesthesia Evaluation    Airway Mallampati: I TM Distance: >3 FB Neck ROM: full    Dental  (+) Teeth Intact   Pulmonary former smoker,  breath sounds clear to auscultation        Cardiovascular Rhythm:regular Rate:Normal     Neuro/Psych    GI/Hepatic GERD-  Medicated,  Endo/Other    Renal/GU      Musculoskeletal   Abdominal   Peds  Hematology   Anesthesia Other Findings   Reproductive/Obstetrics (+) Pregnancy                           Anesthesia Physical Anesthesia Plan  ASA: II  Anesthesia Plan: Epidural   Post-op Pain Management:    Induction:   Airway Management Planned:   Additional Equipment:   Intra-op Plan:   Post-operative Plan:   Informed Consent:   Plan Discussed with:   Anesthesia Plan Comments:         Anesthesia Quick Evaluation

## 2013-09-30 NOTE — Anesthesia Procedure Notes (Signed)
Epidural Patient location during procedure: OB Start time: 09/30/2013 6:59 AM  Staffing Performed by: anesthesiologist   Preanesthetic Checklist Completed: patient identified, site marked, surgical consent, pre-op evaluation, timeout performed, IV checked, risks and benefits discussed and monitors and equipment checked  Epidural Patient position: sitting Prep: site prepped and draped and DuraPrep Patient monitoring: continuous pulse ox and blood pressure Approach: midline Location: L3-L4 Injection technique: LOR air  Needle:  Needle type: Tuohy  Needle gauge: 17 G Needle length: 9 cm and 9 Needle insertion depth: 5 cm cm Catheter type: closed end flexible Catheter size: 19 Gauge Catheter at skin depth: 10 cm Test dose: negative  Assessment Events: blood not aspirated, injection not painful, no injection resistance, negative IV test and no paresthesia  Additional Notes Discussed risk of headache, infection, bleeding, nerve injury and failed or incomplete block.  Patient voices understanding and wishes to proceed.  Epidural placed easily on first attempt.  No paresthesia.  Patient tolerated procedure well with no apparent complications.  Jasmine DecemberA> Tayvia Faughnan, MDReason for block:procedure for pain

## 2013-09-30 NOTE — Progress Notes (Signed)
IV attempt x1 without success. Pt states is a hard stick and last time had "specialists" to start IV. [facility] will call anesthesia to meet pt in [place] to start IV. Pt agrees.

## 2013-09-30 NOTE — MAU Note (Signed)
Peggy LundJanet CRNA called for pt's IV start and in OR case. Will call someone to meet pt in 168 to start pt's IV

## 2013-10-01 ENCOUNTER — Ambulatory Visit: Payer: Self-pay

## 2013-10-01 LAB — CBC
HEMATOCRIT: 35.4 % — AB (ref 36.0–46.0)
HEMOGLOBIN: 12 g/dL (ref 12.0–15.0)
MCH: 31.7 pg (ref 26.0–34.0)
MCHC: 33.9 g/dL (ref 30.0–36.0)
MCV: 93.4 fL (ref 78.0–100.0)
Platelets: 164 10*3/uL (ref 150–400)
RBC: 3.79 MIL/uL — ABNORMAL LOW (ref 3.87–5.11)
RDW: 13 % (ref 11.5–15.5)
WBC: 11.5 10*3/uL — ABNORMAL HIGH (ref 4.0–10.5)

## 2013-10-01 MED ORDER — IBUPROFEN 600 MG PO TABS: 600.0000 mg | ORAL_TABLET | Freq: Four times a day (QID) | ORAL | Status: DC

## 2013-10-01 NOTE — Anesthesia Postprocedure Evaluation (Signed)
Anesthesia Post Note  Patient: Peggy StallSarah Sanchez  Procedure(s) Performed: * No procedures listed *  Anesthesia type: Epidural  Patient location: Mother/Baby  Post pain: Pain level controlled  Post assessment: Post-op Vital signs reviewed  Last Vitals:  Filed Vitals:   10/01/13 0504  BP: 95/55  Pulse:   Temp: 37.2 C  Resp: 18    Post vital signs: Reviewed  Level of consciousness:alert  Complications: No apparent anesthesia complications

## 2013-10-01 NOTE — Discharge Summary (Signed)
Obstetric Discharge Summary Reason for Admission: onset of labor Prenatal Procedures: ultrasound Intrapartum Procedures: spontaneous vaginal delivery and GBS prophylaxis Postpartum Procedures: none Complications-Operative and Postpartum: 1st degree perineal laceration Hemoglobin  Date Value Ref Range Status  10/01/2013 12.0  12.0 - 15.0 g/dL Final     HCT  Date Value Ref Range Status  10/01/2013 35.4* 36.0 - 46.0 % Final    Physical Exam:  General: alert and cooperative Lochia: appropriate Uterine Fundus: firm Incision: healing well, no significant drainage, no dehiscence, no significant erythema DVT Evaluation: No evidence of DVT seen on physical exam. Negative Homan's sign. No cords or calf tenderness. No significant calf/ankle edema.  Discharge Diagnoses: Term Pregnancy-delivered  Discharge Information: Date: 10/01/2013 Activity: pelvic rest Diet: routine Medications: PNV and Ibuprofen Condition: stable Instructions: refer to practice specific booklet Discharge to: home Follow-up Information   Follow up with [name], TANYA, [name]. Schedule an appointment as soon as possible for a visit in 6 weeks.   Specialty:  Obstetrics and Gynecology   Contact information:   [address] Severe[address] [place] [id number] [phone]       Newborn Data: Live born female on 09/30/13 Birth Weight: 7 lb 9.2 oz (3435 g) APGAR: 9, 9  Home with mother.  Placido Hangartner, N 10/01/2013, 1:42 PM

## 2013-10-01 NOTE — Progress Notes (Addendum)
PPD #1- SVD  Subjective:   Reports feeling well, some heightened emotions-teary Desires early discharge Tolerating po/ No nausea or vomiting Bleeding is light Pain controlled with Motrin Up ad lib / ambulatory / voiding without problems Newborn: breastfeeding, some difficulty with supply, lactation assisting  / Circumcision: done   Objective:   VS:  VS:  Filed Vitals:   09/30/13 1530 09/30/13 1630 09/30/13 2021 10/01/13 0504  BP: 88/47 89/50 94/58  95/55  Pulse: 59 61 62   Temp:   98 F (36.7 C) 98.9 F (37.2 C)  TempSrc:   Oral Oral  Resp: 18 18 18 18   Height:      Weight:      SpO2:        LABS:  Recent Labs  09/30/13 0611 10/01/13 0600  WBC 12.7* 11.5*  HGB 12.5 12.0  PLT 201 164   Blood type: A/Negative/-- (11/19 0000) Rubella: Immune (11/19 0000)   I&O: Intake/Output     07/11 0701 - 07/12 0700 07/12 0701 - 07/13 0700   Urine (mL/kg/hr) 250 (0.2)    Blood 200 (0.1)    Total Output 450     Net -450            Physical Exam: Alert and oriented x3 Abdomen: soft, non-tender, non-distended  Fundus: firm, non-tender, U-2 Perineum: Well approximated, no significant erythema, edema, or drainage; healing well. Hemorrhoid cluster present. Lochia: small Extremities: No edema, no calf pain or tenderness    Assessment:  PPD # 1G2P2002/ S/P:spontaneous vaginal, 1st degree laceration  Lactation problem Doing well    Plan: Continue routine post partum orders Lactation for consult and recommendations Possible discharge later pending baby d/c Follow-up in office if mood changes continue beyond 5 days or worsen  Rajvir Ernster, [name], [name] 10/01/2013, 10:34 AM

## 2013-10-01 NOTE — Lactation Note (Signed)
This note was copied from the chart of Peggy [name]. Lactation Consultation Note    Follow up consult with this mom and baby, now 25 hours old, and full term. Mom has a hsitory of breast augmentation, low milk supply, and very little breast tissue. She did SNS with her first baby, and wanted to begin that with this baby today. I reviewed how to use the SNS, and how to care for the parts. I also gave mom and dad a double SNS, for them to use in the future. The baby was still sleepy, but did take about 5 mls fo formula. He intially got milk too quickly, and spit up , but then with adjusting bottle lower to decrease flow, he tolerated feeding well. Mom to pump about every 3-4 hours once home, and continue with S[facility]. Mom to call for o/p lactation consult some time in the next 1-2  Weeks.   Patient Name: Peggy [name] Peggy Sanchez's Date: 10/01/2013 Reason for consult: Follow-up assessment   Maternal Data    Feeding Feeding Type: Formula  LATCH Score/Interventions Latch: Repeated attempts needed to sustain latch, nipple held in mouth throughout feeding, stimulation needed to elicit sucking reflex. (baby sleepy after circumcision) Intervention(s): Skin to skin;Waking techniques Intervention(s): Assist with latch  Audible Swallowing: A few with stimulation  Type of Nipple: Everted at rest and after stimulation (button nipples, nos shaft, )  Comfort (Breast/Nipple): Soft / non-tender (mom has had a breast augmentation, and it is clear with palpatation, where mom's breast tissu and implants meet. Mom seems to hve very little brest tissue, and has a history of low milk suply)     Hold (Positioning): Assistance needed to correctly position infant at breast and maintain latch. Intervention(s): Breastfeeding basics reviewed;Support Pillows;Position options;Skin to skin  LATCH Score: 7  Lactation Tools Discussed/Used Tools: Pump Breast pump type:  Double-Electric Breast Pump Pump Review: Setup, frequency, and cleaning;Milk Storage   Consult Status Consult Status: Complete Follow-up type: Call as needed    Alfred LevinsLee, [place] Veldman Anne 10/01/2013, 3:36 PM

## 2013-10-09 ENCOUNTER — Ambulatory Visit (HOSPITAL_COMMUNITY)
Admission: RE | Admit: 2013-10-09 | Discharge: 2013-10-09 | Disposition: A | Discharge status: Home / Self Care | Payer: BC Managed Care – PPO | Source: Ambulatory Visit | Attending: Obstetrics and Gynecology | Admitting: Obstetrics and Gynecology

## 2013-10-09 NOTE — Lactation Note (Signed)
Lactation Consult  Mother's reason for visit:  Low MS Visit Type:  Outpatient Appointment Notes: Mom is here today related to low MS.  History of AAA bra size and now has breast implants. Her first baby was conceived on clomid.  She is BF, Supplementing with formula and pumping.  She is not sure how much longer she wants to do this.  Today we did switch nursing and Peggy transferred 40 ml of milk.  He was then bottle fed formula and took 60 ml.  She pumped after this and obtained 14 ml.  SHe was able to produce just 54 ml  60 minutes.  She plans to try switch nursing at home and continue pumping and supplementing.  She also plans to attend support group.  We discussed that if she decides to stop BF it is her decision and that she gave it her all.  She will follow-up prn.  Consult:  Follow-Up Lactation Consultant:  Peggy DryerJoseph, Peggy Sanchez  ________________________________________________________________________   Peggy FloresBaby's Name: Peggy NevinsHudson Sanchez  Date of Birth: 09/30/2015  Pediatrician: Peggy Sanchez  Gender: female  Gestational Age: 5238w2d (At Birth)  Birth Weight: 7 lb 9.2 oz (3435 g)  Weight at Discharge: Weight: 7 lb 6.5 oz (3360 g) Date of Discharge: 10/01/2013  Filed Weights   09/30/13 1035 09/30/13 2316  Weight: 7 lb 9.2 oz (3435 g) 7 lb 6.5 oz (3360 g)  [facility] 7[facility] Location:Pediatrician's office  Weight today: 7+2  ________________________________________________________________________  Mother's Name: Peggy Sanchez Type of delivery:  Vaginal Breastfeeding Experience:  3 Months  Maternal Medical Conditions:  None Maternal Medications:  PNV , [place], DHA  ________________________________________________________________________  Breastfeeding History (Post Discharge)  Frequency of breastfeeding:  2.5 - 3 hours Duration of feeding:  10-20 minutes  Pumps and supplements.  Recommended increasing supplement to increase Peggy's weight.  Infant  Intake and Output Assessment  Voids:  8 in 24 hrs.  Color:  Clear yellow Stools:  8 in 24 hrs.  Color:  Yellow  ________________________________________________________________________  Maternal Breast Assessment  Breast:  Full, Widely spaced, Hypoplasia and implants because she was a AAA Nipple:  Erect Pain level:  0 Pain interventions:    _______________________________________________________________________ Feeding Assessment/Evaluation  Initial feeding assessment:    Positioning:  PsychiatristCross cradle and Football Left breast  LATCH documentation:  Latch:  2 = Grasps breast easily, tongue down, lips flanged, rhythmical sucking.  Audible swallowing:  2 = Spontaneous and intermittent  Type of nipple:  2 = Everted at rest and after stimulation  Comfort (Breast/Nipple):  2 = Soft / non-tender  Hold (Positioning):  2 = No assistance needed to correctly position infant at breast  LATCH score:  10  Attached assessment:  Deep  Lips flanged:  Yes.    Lips untucked:  minimal  Suck assessment:  Displays both    Pre-feed weight:  3230 g   Post-feed weight:  3244 g  Amount transferred:  14 ml    Additional Feeding Assessment -     Positioning:  PsychiatristCross cradle and Football Right breast  LATCH documentation:  Latch:  2 = Grasps breast easily, tongue down, lips flanged, rhythmical sucking.  Audible swallowing:  1 = A few with stimulation  Type of nipple:  2 = Everted at rest and after stimulation  Comfort (Breast/Nipple):  2 = Soft / non-tender  Hold (Positioning):  2 = No assistance needed to correctly position infant at breast  LATCH score:  9  Attached  assessment:  Deep  Lips flanged:  Yes.    Lips untucked:  Yes.  minimal  Suck assessment:  Displays both  Pre-feed weight:  3244 g   Post-feed weight:  3252 g Amount transferred:  8 ml Used cross cradle and football hold   Switch nursing : Back to the left side 3268 post (16ml)                               Back to  the right side 3270 (2 ml)  Total amount pumped post feed:  R 4 ml    L 10 ml  Total amount transferred:  40 ml Total supplement given:  34 ml (formula)

## 2013-12-06 ENCOUNTER — Ambulatory Visit (INDEPENDENT_AMBULATORY_CARE_PROVIDER_SITE_OTHER): Payer: BC Managed Care – PPO

## 2013-12-06 ENCOUNTER — Ambulatory Visit (INDEPENDENT_AMBULATORY_CARE_PROVIDER_SITE_OTHER): Payer: BC Managed Care – PPO | Admitting: Sports Medicine

## 2013-12-06 ENCOUNTER — Encounter: Payer: Self-pay | Admitting: Sports Medicine

## 2013-12-06 VITALS — BP 103/65 | HR 53 | Ht 65.0 in | Wt 134.0 lb

## 2013-12-06 DIAGNOSIS — M25562 Pain in left knee: Secondary | ICD-10-CM

## 2013-12-06 DIAGNOSIS — M25569 Pain in unspecified knee: Secondary | ICD-10-CM

## 2013-12-06 DIAGNOSIS — M23209 Derangement of unspecified meniscus due to old tear or injury, unspecified knee: Secondary | ICD-10-CM | POA: Insufficient documentation

## 2013-12-06 DIAGNOSIS — S72492A Other fracture of lower end of left femur, initial encounter for closed fracture: Secondary | ICD-10-CM | POA: Insufficient documentation

## 2013-12-06 MED ORDER — DICLOFENAC POTASSIUM 25 MG PO CAPS: 1.0000 | ORAL_CAPSULE | Freq: Four times a day (QID) | ORAL | Status: DC

## 2013-12-06 NOTE — Assessment & Plan Note (Addendum)
Pain is at the posterior joint line, I do have a slight suspicion for a meniscal injury versus popliteus versus plantaris injury versus hamstring strain. Custom orthotics as above. Hamstring rehabilitation. X-rays, MRI.

## 2013-12-06 NOTE — Patient Instructions (Signed)
Initial Hamstring Rehab Protocol Hamstring curls: Start with 3 sets of 15 (no weight); Progress by 5 reps every 3 days until you reach 3 sets of 30; After 3 days at 3 sets of 30, add 2lb ankle weight at 3 sets of 10; Increase every 5 days by 5 reps. You may add 2lbs ankle weight once weekly. Hamstring swings- swing leg backwards and curl at the end of the swing. Follow same schedule as above. Hamstring running lunges- running lunge position means no more than 45 degrees of knee flexion and running motion. Follow same schedule as above. 

## 2013-12-06 NOTE — Progress Notes (Signed)
   Subjective:    I'm seeing this patient as a consultation for:  Primus Bravo, NP  CC: Left knee pain  HPI: For several weeks this very pleasant 28 year old female runner has had pain she localizes in the posterior aspect of her left knee, she denies any trauma. Pain is moderate, persistent without radiation. It is worse after running, she's not been any swelling or mechanical symptoms. She wonders if orthotics may help her symptoms.  Past medical history, Surgical history, Family history not pertinant except as noted below, Social history, Allergies, and medications have been entered into the medical record, reviewed, and no changes needed.   Review of Systems: No headache, visual changes, nausea, vomiting, diarrhea, constipation, dizziness, abdominal pain, skin rash, fevers, chills, night sweats, weight loss, swollen lymph nodes, body aches, joint swelling, muscle aches, chest pain, shortness of breath, mood changes, visual or auditory hallucinations.   Objective:   General: Well Developed, well nourished, and in no acute distress.  Neuro/Psych: Alert and oriented x3, extra-ocular muscles intact, able to move all 4 extremities, sensation grossly intact. Skin: Warm and dry, no rashes noted.  Respiratory: Not using accessory muscles, speaking in full sentences, trachea midline.  Cardiovascular: Pulses palpable, no extremity edema. Abdomen: Does not appear distended. Left Knee: Normal to inspection with no erythema or effusion or obvious bony abnormalities. There is mild tenderness to palpation along the posterior joint line. This seems to be somewhat medial to and not directly over the semimembranosus and semitendinosus, there is some reproduction of pain with resisted knee flexion. ROM normal in flexion and extension and lower leg rotation. Ligaments with solid consistent endpoints including ACL, PCL, LCL, MCL. Negative Mcmurray's and provocative meniscal tests. Non painful patellar  compression. Patellar and quadriceps tendons unremarkable. Hamstring and quadriceps strength is normal. Leg length discrepancy, 1 cm, right leg longer than left. The patient has bilateral pes cavus.  Patient was fitted for a : standard, cushioned, semi-rigid orthotic. The orthotic was heated and afterward the patient stood on the orthotic blank positioned on the orthotic stand. The patient was positioned in subtalar neutral position and 10 degrees of ankle dorsiflexion in a weight bearing stance. After completion of molding, a stable base was applied to the orthotic blank. The blank was ground to a stable position for weight bearing. Size: 7 Base: White Doctor, hospital and Padding: None The patient ambulated these, and they were very comfortable. The patient has a fairly normal gait with the exception of turning her right foot outward slightly when she runs.  X-rays reviewed and are unremarkable.  Impression and Recommendations:   This case required medical decision making of moderate complexity.

## 2013-12-11 ENCOUNTER — Ambulatory Visit (INDEPENDENT_AMBULATORY_CARE_PROVIDER_SITE_OTHER): Payer: BC Managed Care – PPO

## 2013-12-11 DIAGNOSIS — M25569 Pain in unspecified knee: Secondary | ICD-10-CM

## 2013-12-11 DIAGNOSIS — M25469 Effusion, unspecified knee: Secondary | ICD-10-CM

## 2013-12-11 DIAGNOSIS — M25562 Pain in left knee: Secondary | ICD-10-CM

## 2013-12-19 ENCOUNTER — Encounter: Payer: Self-pay | Admitting: Sports Medicine

## 2013-12-19 ENCOUNTER — Ambulatory Visit (INDEPENDENT_AMBULATORY_CARE_PROVIDER_SITE_OTHER): Payer: BC Managed Care – PPO | Admitting: Sports Medicine

## 2013-12-19 ENCOUNTER — Ambulatory Visit: Payer: BC Managed Care – PPO | Admitting: Sports Medicine

## 2013-12-19 VITALS — BP 93/57 | HR 61 | Wt 132.0 lb

## 2013-12-19 DIAGNOSIS — M23201 Derangement of unspecified lateral meniscus due to old tear or injury, left knee: Secondary | ICD-10-CM

## 2013-12-19 DIAGNOSIS — M23302 Other meniscus derangements, unspecified lateral meniscus, unspecified knee: Secondary | ICD-10-CM

## 2013-12-19 DIAGNOSIS — M23204 Derangement of unspecified medial meniscus due to old tear or injury, left knee: Principal | ICD-10-CM

## 2013-12-19 DIAGNOSIS — M23207 Derangement of unspecified meniscus due to old tear or injury, left knee: Principal | ICD-10-CM

## 2013-12-19 NOTE — Assessment & Plan Note (Signed)
No mechanical symptoms, injection as above. Return in one month to see how things are going.

## 2013-12-19 NOTE — Progress Notes (Signed)
  Subjective:    CC: Followup  HPI: Left knee pain: Present for years, more recently we obtained an MRI that showed increased T2 signal in the posterior horn of the medial meniscus. She continues to have some pain she localizes in this position and is eager to try interventional treatment. Pain is moderate, persistent. Worse with running.  Past medical history, Surgical history, Family history not pertinant except as noted below, Social history, Allergies, and medications have been entered into the medical record, reviewed, and no changes needed.   Review of Systems: No fevers, chills, night sweats, weight loss, chest pain, or shortness of breath.   Objective:    General: Well Developed, well nourished, and in no acute distress.  Neuro: Alert and oriented x3, extra-ocular muscles intact, sensation grossly intact.  HEENT: Normocephalic, atraumatic, pupils equal round reactive to light, neck supple, no masses, no lymphadenopathy, thyroid nonpalpable.  Skin: Warm and dry, no rashes. Cardiac: Regular rate and rhythm, no murmurs rubs or gallops, no lower extremity edema.  Respiratory: Clear to auscultation bilaterally. Not using accessory muscles, speaking in full sentences. Left Knee: Normal to inspection with no erythema or effusion or obvious bony abnormalities. Tender to palpation at the medial joint line posteriorly. ROM normal in flexion and extension and lower leg rotation. Ligaments with solid consistent endpoints including ACL, PCL, LCL, MCL. Negative Mcmurray's and provocative meniscal tests. Non painful patellar compression. Patellar and quadriceps tendons unremarkable. Hamstring and quadriceps strength is normal.  Procedure: Real-time Ultrasound Guided Injection of left knee Device: GE Logiq E  Verbal informed consent obtained.  Time-out conducted.  Noted no overlying erythema, induration, or other signs of local infection.  Skin prepped in a sterile fashion.  Local  anesthesia: Topical Ethyl chloride.  With sterile technique and under real time ultrasound guidance:  2 cc Kenalog 40, 4 cc lidocaine injected easily into the suprapatellar recess. Completed without difficulty  Pain immediately resolved suggesting accurate placement of the medication.  Advised to call if fevers/chills, erythema, induration, drainage, or persistent bleeding.  Images permanently stored and available for review in the ultrasound unit.  Impression: Technically successful ultrasound guided injection.  Impression and Recommendations:

## 2014-01-16 ENCOUNTER — Encounter: Payer: Self-pay | Admitting: Sports Medicine

## 2014-01-16 ENCOUNTER — Ambulatory Visit (INDEPENDENT_AMBULATORY_CARE_PROVIDER_SITE_OTHER): Payer: BC Managed Care – PPO | Admitting: Sports Medicine

## 2014-01-16 VITALS — BP 86/57 | HR 52 | Wt 132.0 lb

## 2014-01-16 DIAGNOSIS — M23204 Derangement of unspecified medial meniscus due to old tear or injury, left knee: Secondary | ICD-10-CM | POA: Diagnosis not present

## 2014-01-16 DIAGNOSIS — Z23 Encounter for immunization: Secondary | ICD-10-CM

## 2014-01-16 DIAGNOSIS — M23207 Derangement of unspecified meniscus due to old tear or injury, left knee: Secondary | ICD-10-CM | POA: Diagnosis not present

## 2014-01-16 DIAGNOSIS — M23201 Derangement of unspecified lateral meniscus due to old tear or injury, left knee: Secondary | ICD-10-CM

## 2014-01-16 NOTE — Progress Notes (Signed)
  Subjective:    CC: follow-up  HPI: Left knee pain: There is a chronic appearing meniscal tear, we injected in a month ago and she returns today pain-free. Has not been doing her rehabilitation exercises.  Past medical history, Surgical history, Family history not pertinant except as noted below, Social history, Allergies, and medications have been entered into the medical record, reviewed, and no changes needed.   Review of Systems: No fevers, chills, night sweats, weight loss, chest pain, or shortness of breath.   Objective:    General: Well Developed, well nourished, and in no acute distress.  Neuro: Alert and oriented x3, extra-ocular muscles intact, sensation grossly intact.  HEENT: Normocephalic, atraumatic, pupils equal round reactive to light, neck supple, no masses, no lymphadenopathy, thyroid nonpalpable.  Skin: Warm and dry, no rashes. Cardiac: Regular rate and rhythm, no murmurs rubs or gallops, no lower extremity edema.  Respiratory: Clear to auscultation bilaterally. Not using accessory muscles, speaking in full sentences. leftKnee: Normal to inspection with no erythema or effusion or obvious bony abnormalities. Palpation normal with no warmth or joint line tenderness or patellar tenderness or condyle tenderness. ROM normal in flexion and extension and lower leg rotation. Ligaments with solid consistent endpoints including ACL, PCL, LCL, MCL. Negative Mcmurray's and provocative meniscal tests. Non painful patellar compression. Patellar and quadriceps tendons unremarkable. Hamstring and quadriceps strength is normal. Left hip abductor's are strong, right side is very weak.  Impression and Recommendations:

## 2014-01-16 NOTE — Patient Instructions (Signed)
Hip Rehabilitation Protocol:  1.  Side leg raises.  3x30 with no weight, then 3x15 with 2 lb ankle weight, then 3x15 with 5 lb ankle weight 2.  Standing hip rotation.  3x30 with no weight, then 3x15 with 2 lb ankle weight, then 3x15 with 5 lb ankle weight. 3.  Side step ups.  3x30 with no weight, then 3x15 with 5 lbs in backpack, then 3x15 with 10 lbs in backpack. 

## 2014-01-16 NOTE — Assessment & Plan Note (Signed)
Pain-free after injection. Needs to increase compliance with exercises. Return in 2 months.

## 2014-01-22 ENCOUNTER — Encounter: Payer: Self-pay | Admitting: Sports Medicine

## 2014-01-30 ENCOUNTER — Encounter: Payer: Self-pay | Admitting: *Deleted

## 2014-01-31 ENCOUNTER — Ambulatory Visit (INDEPENDENT_AMBULATORY_CARE_PROVIDER_SITE_OTHER): Payer: BC Managed Care – PPO | Admitting: *Deleted

## 2014-01-31 DIAGNOSIS — I780 Hereditary hemorrhagic telangiectasia: Secondary | ICD-10-CM

## 2014-01-31 DIAGNOSIS — I8393 Asymptomatic varicose veins of bilateral lower extremities: Secondary | ICD-10-CM | POA: Insufficient documentation

## 2014-01-31 NOTE — Progress Notes (Signed)
X=.3% Sotradecol administered with a 27g butterfly.  Patient received a total of 5cc foam.  Treated all areas of concern. Semi-easy access. Some of the vessels were tiny. Was able to get good coverage thru slightly larger spiders. Tol very well. Will follow prn.  Photos: Yes.    Compression stockings applied: Yes.

## 2014-02-01 ENCOUNTER — Encounter: Payer: Self-pay | Admitting: *Deleted

## 2014-03-19 ENCOUNTER — Ambulatory Visit: Payer: BC Managed Care – PPO | Admitting: Sports Medicine

## 2014-03-20 ENCOUNTER — Ambulatory Visit (INDEPENDENT_AMBULATORY_CARE_PROVIDER_SITE_OTHER): Payer: BC Managed Care – PPO | Admitting: Sports Medicine

## 2014-03-20 ENCOUNTER — Encounter: Payer: Self-pay | Admitting: Sports Medicine

## 2014-03-20 DIAGNOSIS — M23207 Derangement of unspecified meniscus due to old tear or injury, left knee: Secondary | ICD-10-CM | POA: Diagnosis not present

## 2014-03-20 DIAGNOSIS — M23204 Derangement of unspecified medial meniscus due to old tear or injury, left knee: Secondary | ICD-10-CM

## 2014-03-20 DIAGNOSIS — M23201 Derangement of unspecified lateral meniscus due to old tear or injury, left knee: Secondary | ICD-10-CM

## 2014-03-20 NOTE — Assessment & Plan Note (Addendum)
Anterior and medial pain is essentially resolved after intra-articular injection 3 months ago. She still has some pain now that she localizes in the posterior medial knee joint, after extensive examination I am able to identify it as coming from the medial head of the gastrocnemius. All of her meniscal exam and uterus are negative including palpation over the posterior horn of the medial meniscus under direct ultrasound guidance. We will going to proceed conservatively however per patient request I did perform an intra-muscular injection at the medial head of the gastrocnemius is passes over the posterior knee joint. Knee was then strapped with compressive dressing. Physical therapy. Knee sleeve. Return in one month, consider arthroscopy if no better.

## 2014-03-20 NOTE — Progress Notes (Signed)
  Subjective:    CC: Left knee pain  HPI: Peggy Sanchez returns, she does have a intrasubstance meniscal tear in the posterior horn of the medial meniscus. We injected her knee after failure of physical therapy approximately 3 months ago. Initially she did extremely well, at that time she had pain predominantly in the anterior knee, this has resolved. More recently she describes a long history of pain in the posterior medial joint line, worse after running but not during or before. She denies any mechanical symptoms, and does feel slight full as in the posterior aspect of the knee. She does desire aggressive and interventional treatment today of possible.   Past medical history, Surgical history, Family history not pertinant except as noted below, Social history, Allergies, and medications have been entered into the medical record, reviewed, and no changes needed.   Review of Systems: No fevers, chills, night sweats, weight loss, chest pain, or shortness of breath.   Objective:    General: Well Developed, well nourished, and in no acute distress.  Neuro: Alert and oriented x3, extra-ocular muscles intact, sensation grossly intact.  HEENT: Normocephalic, atraumatic, pupils equal round reactive to light, neck supple, no masses, no lymphadenopathy, thyroid nonpalpable.  Skin: Warm and dry, no rashes. Cardiac: Regular rate and rhythm, no murmurs rubs or gallops, no lower extremity edema.  Respiratory: Clear to auscultation bilaterally. Not using accessory muscles, speaking in full sentences. Left Knee: Normal to inspection with no erythema or effusion or obvious bony abnormalities. Palpation normal with no warmth or joint line tenderness or patellar tenderness or condyle tenderness. I was unable to reproduce any tenderness to palpation over the joint line. I was able to localize tenderness to palpation over the medial head of the gastrocnemius just distal to the joint line. ROM normal in flexion and  extension and lower leg rotation. Ligaments with solid consistent endpoints including ACL, PCL, LCL, MCL. Negative Mcmurray's and provocative meniscal tests. Non painful patellar compression. Patellar and quadriceps tendons unremarkable. Hamstring and quadriceps strength is normal.  Procedure: Real-time Ultrasound Guided Injection of medial head of the gastrocnemius Device: GE Logiq E  Verbal informed consent obtained.  Time-out conducted.  Noted no overlying erythema, induration, or other signs of local infection.  Skin prepped in a sterile fashion.  Local anesthesia: Topical Ethyl chloride.  With sterile technique and under real time ultrasound guidance: 25-gauge needle advanced into the most tender location, 1 mL kenalog 40, 4 mL lidocaine injected easily in a fanlike pattern.  Completed without difficulty  Pain immediately resolved suggesting accurate placement of the medication.  Advised to call if fevers/chills, erythema, induration, drainage, or persistent bleeding.  Images permanently stored and available for review in the ultrasound unit.  Impression: Technically successful ultrasound guided injection.  Impression and Recommendations:

## 2014-04-17 ENCOUNTER — Ambulatory Visit: Payer: BC Managed Care – PPO | Admitting: Sports Medicine

## 2014-04-20 ENCOUNTER — Other Ambulatory Visit: Payer: Self-pay | Admitting: Orthopedic Surgery

## 2014-04-23 ENCOUNTER — Encounter (HOSPITAL_BASED_OUTPATIENT_CLINIC_OR_DEPARTMENT_OTHER): Payer: Self-pay | Admitting: *Deleted

## 2014-04-23 DIAGNOSIS — S83249A Other tear of medial meniscus, current injury, unspecified knee, initial encounter: Secondary | ICD-10-CM

## 2014-04-23 HISTORY — DX: Other tear of medial meniscus, current injury, unspecified knee, initial encounter: S83.249A

## 2014-04-25 NOTE — H&P (Signed)
Peggy Sanchez is an 29 y.o. female.    Chief Complaint: Left knee pain  HPI: Peggy Sanchez is a pleasant 29 year old female returning to the office today with complaints of left knee pain.  She states she's been experiencing knee pain for 4 years.  She has tried physical therapy which has not helped and 2 knee injections which have given her minimal relief.  She describes the pain as constant, moderate, dull, aching.  She states the pain has been getting worse and it does not wake her from sleep.  She states activity, exercise, and work make the symptoms worse.  She states rest makes the pain feel better. She has not experienced any injury or fall.  Past Medical History  Diagnosis Date  . Medial meniscus tear 04/2014    left knee  . Dental crowns present   . Interstitial cystitis   . Difficult intravenous access     Past Surgical History  Procedure Laterality Date  . Cystoscopy with hydrodistension and biopsy  05/15/2010    bladder instillation therapy  . Inguinal hernia repair Bilateral     as a child  . Breast enhancement surgery Bilateral   . Wisdom tooth extraction      History reviewed. No pertinent family history. Social History:  reports that she quit smoking about 7 years ago. She has never used smokeless tobacco. She reports that she drinks alcohol. She reports that she does not use illicit drugs.  Allergies: No Known Allergies  No prescriptions prior to admission    No results found for this or any previous visit (from the past 48 hour(s)). No results found.  Review of Systems  Constitutional: Negative.   HENT: Negative.   Eyes: Negative.   Respiratory: Negative.   Cardiovascular: Negative.   Gastrointestinal: Negative.   Genitourinary: Negative.   Musculoskeletal: Positive for joint pain.  Skin: Negative.   Neurological: Negative.   Endo/Heme/Allergies: Negative.   Psychiatric/Behavioral: Negative.     Height 5\' 5"  (1.651 m), weight 58.968 kg (130 lb),  currently breastfeeding. Physical Exam  Constitutional: She is oriented to person, place, and time. She appears well-developed and well-nourished.  HENT:  Head: Normocephalic and atraumatic.  Eyes: Pupils are equal, round, and reactive to light.  Neck: Normal range of motion. Neck supple.  Cardiovascular: Intact distal pulses.   Respiratory: Effort normal.  Musculoskeletal:  No skin rash.  Examination of the knee today shows no erythema or edema.  Full range of motion.  Ligaments intact.  Neurological: She is alert and oriented to person, place, and time.  Skin: Skin is warm and dry.  Psychiatric: She has a normal mood and affect. Her behavior is normal. Judgment and thought content normal.     Assessment/Plan ASSESSMENT: Medial Meniscus tear   PLAN: Due to unsuccessful conservative treatment, the patient seemed resected to an arthroscopic evaluation of her knee. We will get her set up for arthroscopic evaluation treatment of her knee sooner than later.  Risks and benefits of surgery discussed at length and will discuss further rat the time surgical intervention.  We also discussed the effects of narcotics while breast-feeding and suggested to her to utilize formula if she plans on utilizing the narcotics.   Ariel Wingrove R 04/25/2014, 3:59 PM

## 2014-04-27 ENCOUNTER — Ambulatory Visit (HOSPITAL_BASED_OUTPATIENT_CLINIC_OR_DEPARTMENT_OTHER): Payer: 59 | Admitting: Certified Registered"

## 2014-04-27 ENCOUNTER — Ambulatory Visit (HOSPITAL_BASED_OUTPATIENT_CLINIC_OR_DEPARTMENT_OTHER)
Admission: RE | Admit: 2014-04-27 | Discharge: 2014-04-27 | Disposition: A | Discharge status: Home / Self Care | Payer: 59 | Source: Ambulatory Visit | Attending: Orthopedic Surgery | Admitting: Orthopedic Surgery

## 2014-04-27 ENCOUNTER — Encounter (HOSPITAL_BASED_OUTPATIENT_CLINIC_OR_DEPARTMENT_OTHER)
Admission: RE | Disposition: A | Discharge status: Home / Self Care | Payer: Self-pay | Source: Ambulatory Visit | Attending: Orthopedic Surgery

## 2014-04-27 ENCOUNTER — Encounter (HOSPITAL_BASED_OUTPATIENT_CLINIC_OR_DEPARTMENT_OTHER): Payer: Self-pay | Admitting: Certified Registered"

## 2014-04-27 DIAGNOSIS — Z87891 Personal history of nicotine dependence: Secondary | ICD-10-CM | POA: Diagnosis not present

## 2014-04-27 DIAGNOSIS — M25562 Pain in left knee: Secondary | ICD-10-CM | POA: Diagnosis present

## 2014-04-27 DIAGNOSIS — M2242 Chondromalacia patellae, left knee: Secondary | ICD-10-CM | POA: Diagnosis not present

## 2014-04-27 DIAGNOSIS — M6752 Plica syndrome, left knee: Secondary | ICD-10-CM | POA: Diagnosis not present

## 2014-04-27 DIAGNOSIS — M94262 Chondromalacia, left knee: Secondary | ICD-10-CM

## 2014-04-27 HISTORY — PX: KNEE ARTHROSCOPY: SHX127

## 2014-04-27 HISTORY — DX: Interstitial cystitis (chronic) without hematuria: N30.10

## 2014-04-27 HISTORY — DX: Other specified health status: Z78.9

## 2014-04-27 HISTORY — DX: Dental restoration status: Z98.811

## 2014-04-27 HISTORY — PX: CHONDROPLASTY: SHX5177

## 2014-04-27 HISTORY — DX: Other tear of medial meniscus, current injury, unspecified knee, initial encounter: S83.249A

## 2014-04-27 SURGERY — ARTHROSCOPY, KNEE
Anesthesia: General | Site: Knee | Laterality: Left

## 2014-04-27 MED ORDER — CEFAZOLIN SODIUM-DEXTROSE 2-3 GM-% IV SOLR
2.0000 g | INTRAVENOUS | Status: AC
  Administered 2014-04-27: 2 g via INTRAVENOUS

## 2014-04-27 MED ORDER — KETOROLAC TROMETHAMINE 30 MG/ML IJ SOLN: 30.0000 mg | Freq: Once | INTRAMUSCULAR | Status: DC | PRN

## 2014-04-27 MED ORDER — EPHEDRINE SULFATE 50 MG/ML IJ SOLN
INTRAMUSCULAR | Status: DC | PRN
  Administered 2014-04-27 (×2): 10 mg via INTRAVENOUS

## 2014-04-27 MED ORDER — PROMETHAZINE HCL 25 MG/ML IJ SOLN: 6.2500 mg | INTRAMUSCULAR | Status: DC | PRN

## 2014-04-27 MED ORDER — LIDOCAINE HCL (CARDIAC) 20 MG/ML IV SOLN
INTRAVENOUS | Status: DC | PRN
  Administered 2014-04-27: 60 mg via INTRAVENOUS

## 2014-04-27 MED ORDER — FENTANYL CITRATE 0.05 MG/ML IJ SOLN
25.0000 ug | INTRAMUSCULAR | Status: DC | PRN
  Administered 2014-04-27 (×2): 50 ug via INTRAVENOUS

## 2014-04-27 MED ORDER — MIDAZOLAM HCL 2 MG/ML PO SYRP: 12.0000 mg | ORAL_SOLUTION | Freq: Once | ORAL | Status: DC | PRN

## 2014-04-27 MED ORDER — DEXTROSE-NACL 5-0.45 % IV SOLN: INTRAVENOUS | Status: DC

## 2014-04-27 MED ORDER — BUPIVACAINE-EPINEPHRINE 0.5% -1:200000 IJ SOLN
INTRAMUSCULAR | Status: DC | PRN
  Administered 2014-04-27: 20 mL

## 2014-04-27 MED ORDER — PROPOFOL 10 MG/ML IV EMUL
INTRAVENOUS | Status: AC
  Filled 2014-04-27: qty 50

## 2014-04-27 MED ORDER — MIDAZOLAM HCL 2 MG/2ML IJ SOLN: 1.0000 mg | INTRAMUSCULAR | Status: DC | PRN

## 2014-04-27 MED ORDER — BUPIVACAINE-EPINEPHRINE (PF) 0.5% -1:200000 IJ SOLN
INTRAMUSCULAR | Status: AC
  Filled 2014-04-27: qty 30

## 2014-04-27 MED ORDER — FENTANYL CITRATE 0.05 MG/ML IJ SOLN: 50.0000 ug | INTRAMUSCULAR | Status: DC | PRN

## 2014-04-27 MED ORDER — FENTANYL CITRATE 0.05 MG/ML IJ SOLN
INTRAMUSCULAR | Status: DC | PRN
  Administered 2014-04-27: 50 ug via INTRAVENOUS

## 2014-04-27 MED ORDER — DEXAMETHASONE SODIUM PHOSPHATE 4 MG/ML IJ SOLN
INTRAMUSCULAR | Status: DC | PRN
  Administered 2014-04-27: 10 mg via INTRAVENOUS

## 2014-04-27 MED ORDER — CEFAZOLIN SODIUM 1-5 GM-% IV SOLN
INTRAVENOUS | Status: AC
  Filled 2014-04-27: qty 100

## 2014-04-27 MED ORDER — ONDANSETRON HCL 4 MG/2ML IJ SOLN
INTRAMUSCULAR | Status: DC | PRN
  Administered 2014-04-27: 4 mg via INTRAVENOUS

## 2014-04-27 MED ORDER — SODIUM CHLORIDE 0.9 % IR SOLN
Status: DC | PRN
  Administered 2014-04-27: 3000 mL

## 2014-04-27 MED ORDER — CHLORHEXIDINE GLUCONATE 4 % EX LIQD: 60.0000 mL | Freq: Once | CUTANEOUS | Status: DC

## 2014-04-27 MED ORDER — PROPOFOL 10 MG/ML IV BOLUS
INTRAVENOUS | Status: DC | PRN
Start: 2014-04-27 — End: 2014-04-27
  Administered 2014-04-27: 150 mg via INTRAVENOUS

## 2014-04-27 MED ORDER — LACTATED RINGERS IV SOLN
INTRAVENOUS | Status: DC
  Administered 2014-04-27: 07:00:00 via INTRAVENOUS

## 2014-04-27 MED ORDER — EPINEPHRINE HCL 1 MG/ML IJ SOLN
INTRAMUSCULAR | Status: AC
  Filled 2014-04-27: qty 1

## 2014-04-27 MED ORDER — MIDAZOLAM HCL 5 MG/5ML IJ SOLN
INTRAMUSCULAR | Status: DC | PRN
  Administered 2014-04-27: 1 mg via INTRAVENOUS

## 2014-04-27 MED ORDER — TAPENTADOL HCL 50 MG PO TABS: 50.0000 mg | ORAL_TABLET | Freq: Four times a day (QID) | ORAL | Status: DC | PRN

## 2014-04-27 MED ORDER — FENTANYL CITRATE 0.05 MG/ML IJ SOLN
INTRAMUSCULAR | Status: AC
  Filled 2014-04-27: qty 2

## 2014-04-27 MED ORDER — MIDAZOLAM HCL 2 MG/2ML IJ SOLN
INTRAMUSCULAR | Status: AC
  Filled 2014-04-27: qty 2

## 2014-04-27 MED ORDER — EPINEPHRINE HCL 1 MG/ML IJ SOLN
INTRAMUSCULAR | Status: DC | PRN
  Administered 2014-04-27: 1 mg

## 2014-04-27 MED ORDER — FENTANYL CITRATE 0.05 MG/ML IJ SOLN
INTRAMUSCULAR | Status: AC
  Filled 2014-04-27: qty 4

## 2014-04-27 SURGICAL SUPPLY — 47 items
BANDAGE ELASTIC 6 VELCRO ST LF (GAUZE/BANDAGES/DRESSINGS) ×3 IMPLANT
BLADE 4.2CUDA (BLADE) IMPLANT
BLADE CUTTER GATOR 3.5 (BLADE) ×3 IMPLANT
BLADE GREAT WHITE 4.2 (BLADE) IMPLANT
BLADE GREAT WHITE 4.2MM (BLADE)
BNDG COHESIVE 6X5 TAN STRL LF (GAUZE/BANDAGES/DRESSINGS) ×3 IMPLANT
DRAPE ARTHROSCOPY W/POUCH 114 (DRAPES) ×3 IMPLANT
DURAPREP 26ML APPLICATOR (WOUND CARE) ×3 IMPLANT
ELECT MENISCUS 165MM 90D (ELECTRODE) IMPLANT
ELECT REM PT RETURN 9FT ADLT (ELECTROSURGICAL)
ELECTRODE REM PT RTRN 9FT ADLT (ELECTROSURGICAL) IMPLANT
GAUZE SPONGE 4X4 12PLY STRL (GAUZE/BANDAGES/DRESSINGS) ×3 IMPLANT
GAUZE XEROFORM 1X8 LF (GAUZE/BANDAGES/DRESSINGS) ×3 IMPLANT
GLOVE BIO SURGEON STRL SZ 6.5 (GLOVE) ×2 IMPLANT
GLOVE BIO SURGEON STRL SZ7.5 (GLOVE) ×3 IMPLANT
GLOVE BIO SURGEON STRL SZ8.5 (GLOVE) ×3 IMPLANT
GLOVE BIO SURGEONS STRL SZ 6.5 (GLOVE) ×1
GLOVE BIOGEL PI IND STRL 6.5 (GLOVE) ×1 IMPLANT
GLOVE BIOGEL PI IND STRL 7.0 (GLOVE) ×1 IMPLANT
GLOVE BIOGEL PI IND STRL 8 (GLOVE) ×1 IMPLANT
GLOVE BIOGEL PI IND STRL 9 (GLOVE) ×1 IMPLANT
GLOVE BIOGEL PI INDICATOR 6.5 (GLOVE) ×2
GLOVE BIOGEL PI INDICATOR 7.0 (GLOVE) ×2
GLOVE BIOGEL PI INDICATOR 8 (GLOVE) ×2
GLOVE BIOGEL PI INDICATOR 9 (GLOVE) ×2
GLOVE ECLIPSE 6.5 STRL STRAW (GLOVE) ×3 IMPLANT
GOWN STRL REUS W/ TWL LRG LVL3 (GOWN DISPOSABLE) ×2 IMPLANT
GOWN STRL REUS W/ TWL XL LVL3 (GOWN DISPOSABLE) ×2 IMPLANT
GOWN STRL REUS W/TWL LRG LVL3 (GOWN DISPOSABLE) ×4
GOWN STRL REUS W/TWL XL LVL3 (GOWN DISPOSABLE) ×4
IV NS IRRIG 3000ML ARTHROMATIC (IV SOLUTION) ×3 IMPLANT
KNEE WRAP E Z 3 GEL PACK (MISCELLANEOUS) ×3 IMPLANT
MANIFOLD NEPTUNE II (INSTRUMENTS) ×3 IMPLANT
NDL SAFETY ECLIPSE 18X1.5 (NEEDLE) ×1 IMPLANT
NEEDLE HYPO 18GX1.5 SHARP (NEEDLE) ×2
PACK ARTHROSCOPY DSU (CUSTOM PROCEDURE TRAY) ×3 IMPLANT
PACK BASIN DAY SURGERY FS (CUSTOM PROCEDURE TRAY) ×3 IMPLANT
PAD ALCOHOL SWAB (MISCELLANEOUS) ×3 IMPLANT
PENCIL BUTTON HOLSTER BLD 10FT (ELECTRODE) IMPLANT
SET ARTHROSCOPY TUBING (MISCELLANEOUS) ×2
SET ARTHROSCOPY TUBING LN (MISCELLANEOUS) ×1 IMPLANT
SLEEVE SCD COMPRESS KNEE MED (MISCELLANEOUS) IMPLANT
SYR 3ML 18GX1 1/2 (SYRINGE) IMPLANT
SYR 5ML LL (SYRINGE) ×3 IMPLANT
TOWEL OR 17X24 6PK STRL BLUE (TOWEL DISPOSABLE) ×3 IMPLANT
WAND STAR VAC 90 (SURGICAL WAND) IMPLANT
WATER STERILE IRR 1000ML POUR (IV SOLUTION) ×3 IMPLANT

## 2014-04-27 NOTE — Discharge Instructions (Addendum)
Arthroscopic Procedure, Knee °An arthroscopic procedure can find what is wrong with your knee. °PROCEDURE °Arthroscopy is a surgical technique that allows your orthopedic surgeon to diagnose and treat your knee injury with accuracy. They will look into your knee through a small instrument. This is almost like a small (pencil sized) telescope. Because arthroscopy affects your knee less than open knee surgery, you can anticipate a more rapid recovery. Taking an active role by following your caregiver's instructions will help with rapid and complete recovery. Use crutches, rest, elevation, ice, and knee exercises as instructed. The length of recovery depends on various factors including type of injury, age, physical condition, medical conditions, and your rehabilitation. °Your knee is the joint between the large bones (femur and tibia) in your leg. Cartilage covers these bone ends which are smooth and slippery and allow your knee to bend and move smoothly. Two menisci, thick, semi-lunar shaped pads of cartilage which form a rim inside the joint, help absorb shock and stabilize your knee. Ligaments bind the bones together and support your knee joint. Muscles move the joint, help support your knee, and take stress off the joint itself. Because of this all programs and physical therapy to rehabilitate an injured or repaired knee require rebuilding and strengthening your muscles. °AFTER THE PROCEDURE °· After the procedure, you will be moved to a recovery area until most of the effects of the medication have worn off. Your caregiver will discuss the test results with you. °· Only take over-the-counter or prescription medicines for pain, discomfort, or fever as directed by your caregiver. °SEEK MEDICAL CARE IF:  °· You have increased bleeding from your wounds. °· You see redness, swelling, or have increasing pain in your wounds. °· You have pus coming from your wound. °· You have an oral temperature above 102° F (38.9°  C). °· You notice a bad smell coming from the wound or dressing. °· You have severe pain with any motion of your knee. °SEEK IMMEDIATE MEDICAL CARE IF:  °· You develop a rash. °· You have difficulty breathing. °· You have any allergic problems. °Document Released: 03/06/2000 Document Revised: 06/01/2011 Document Reviewed: 09/28/2007 °[employer]® Patient Information ©2015 [employer]. This information is not intended to replace advice given to you by your health care provider. Make sure you discuss any questions you have with your health care provider. ° °Post Anesthesia Home Care Instructions ° °Activity: °Get plenty of rest for the remainder of the day. A responsible adult should stay with you for 24 hours following the procedure.  °For the next 24 hours, DO NOT: °-Drive a car °-Operate machinery °-Drink alcoholic beverages °-Take any medication unless instructed by your physician °-Make any legal decisions or sign important papers. ° °Meals: °Start with liquid foods such as gelatin or soup. Progress to regular foods as tolerated. Avoid greasy, spicy, heavy foods. If nausea and/or vomiting occur, drink only clear liquids until the nausea and/or vomiting subsides. Call your physician if vomiting continues. ° °Special Instructions/Symptoms: °Your throat may feel dry or sore from the anesthesia or the breathing tube placed in your throat during surgery. If this causes discomfort, gargle with warm salt water. The discomfort should disappear within 24 hours. ° °

## 2014-04-27 NOTE — Anesthesia Postprocedure Evaluation (Signed)
  Anesthesia Post-op Note  Patient: Peggy StallSarah Sanchez  Procedure(s) Performed: Procedure(s) (LRB): LEFT KNEE ARTHROSCOPY, DEBRIDEMENT OF CHONDROMALACIA  AND EXCISION OF FIBROUS BANDS (Left)  Patient Location: PACU  Anesthesia Type: General  Level of Consciousness: awake and alert   Airway and Oxygen Therapy: Patient Spontanous Breathing  Post-op Pain: mild  Post-op Assessment: Post-op Vital signs reviewed, Patient's Cardiovascular Status Stable, Respiratory Function Stable, Patent Airway and No signs of Nausea or vomiting  Last Vitals:  Filed Vitals:   04/27/14 0621  BP: 98/65  Pulse: 53  Temp: 36.7 C  Resp: 16    Post-op Vital Signs: stable   Complications: No apparent anesthesia complications

## 2014-04-27 NOTE — Op Note (Signed)
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    Procedure: Left knee debridement of chondromalacia trochlea and excision of fibrous bands  Surgeon: Feliberto GottronFrank J. Turner Danielsowan M.D.  Assist: Tomi LikensEric K. Gaylene BrooksPhillips PA-C  (present throughout entire procedure and necessary for timely completion of the procedure) Anes: General LMA  EBL: Minimal  Fluids: 800 cc   Indications: Patient reports catching and pain in the medial posterior aspect of the left knee. MRI scan showed intrasubstance edema in the medial meniscus possible tearing.. Pt has failed conservative treatment with anti-inflammatory medicines, physical therapy, and modified activites but did get good temporarily from an intra-articular cortisone injection. Pain has recurred and patient desires elective arthroscopic evaluation and treatment of knee. Risks and benefits of surgery have been discussed and questions answered.  Procedure: Patient identified by arm band and taken to the operating room at the day surgery Center. The appropriate anesthetic monitors were attached, and General LMA anesthesia was induced without difficulty. Lateral post was applied to the table and the lower extremity was prepped and draped in usual sterile fashion from the ankle to the midthigh. Time out procedure was performed. We began the operation by making standard inferior lateral and inferior medial peripatellar portals with a #11 blade allowing introduction of the arthroscope through the inferior lateral portal and the out flow to the inferior medial portal. Pump pressure was set at 100 mmHg and diagnostic arthroscopy  revealed grade 3 chondromalacia.Focal endotracheal closure was debrided back to a stable margin with a 3.5 mm Gator smoker shaver. Moving into the medial compartment the articular meniscal cartilage in good condition the cruciate ligaments were intact  and the lateral compartment was in good condition as well. In the suprapatellar pouch were identified a few fibrous bands that traversed the region and these were also excised at L3-4 Gator sucker shaver. We also removed a superomedial plica that was minimally inflamed. The knee was irrigated out normal saline solution. A dressing of xerofoam 4 x 4 dressing sponges, web roll and an Ace wrap was applied. The patient was awakened extubated and taken to the recovery without difficulty.    Signed: Nestor LewandowskyFrank J Emmary Culbreath, MD

## 2014-04-27 NOTE — Anesthesia Procedure Notes (Signed)
Procedure Name: LMA Insertion Date/Time: 04/27/2014 7:35 AM Performed by: Concha Sudol Pre-anesthesia Checklist: Patient identified, Emergency Drugs available, Suction available and Patient being monitored Patient Re-evaluated:Patient Re-evaluated prior to inductionOxygen Delivery Method: Circle System Utilized Preoxygenation: Pre-oxygenation with 100% oxygen Intubation Type: IV induction Ventilation: Mask ventilation without difficulty LMA: LMA inserted LMA Size: 4.0 Number of attempts: 1 Airway Equipment and Method: Bite block Placement Confirmation: positive ETCO2 Tube secured with: Tape Dental Injury: Teeth and Oropharynx as per pre-operative assessment

## 2014-04-27 NOTE — Interval H&P Note (Signed)
History and Physical Interval Note:  04/27/2014 7:18 AM  Peggy StallSarah Sanchez  has presented today for surgery, with the diagnosis of LEFT KNEE MEDIAL MENISCUS TEAR  The various methods of treatment have been discussed with the patient and family. After consideration of risks, benefits and other options for treatment, the patient has consented to  Procedure(s): LEFT ARTHROSCOPY KNEE (Left) as a surgical intervention .  The patient's history has been reviewed, patient examined, no change in status, stable for surgery.  I have reviewed the patient's chart and labs.  Questions were answered to the patient's satisfaction.     Nestor LewandowskyOWAN,Mirna Sutcliffe J

## 2014-04-27 NOTE — Anesthesia Preprocedure Evaluation (Addendum)
Anesthesia Evaluation  Patient identified by MRN, date of birth, ID band Patient awake    Reviewed: Allergy & Precautions, NPO status , Patient's Chart, lab work & pertinent test results  Airway Mallampati: II  TM Distance: >3 FB Neck ROM: Full    Dental no notable dental hx.    Pulmonary neg pulmonary ROS, former smoker,  breath sounds clear to auscultation  Pulmonary exam normal       Cardiovascular negative cardio ROS  Rhythm:Regular Rate:Normal     Neuro/Psych negative neurological ROS  negative psych ROS   GI/Hepatic negative GI ROS, Neg liver ROS,   Endo/Other  negative endocrine ROS  Renal/GU negative Renal ROS  negative genitourinary   Musculoskeletal negative musculoskeletal ROS (+)   Abdominal   Peds negative pediatric ROS (+)  Hematology negative hematology ROS (+)   Anesthesia Other Findings   Reproductive/Obstetrics negative OB ROS                            Anesthesia Physical Anesthesia Plan  ASA: I  Anesthesia Plan: General   Post-op Pain Management:    Induction: Intravenous  Airway Management Planned: LMA  Additional Equipment:   Intra-op Plan:   Post-operative Plan: Extubation in OR  Informed Consent: I have reviewed the patients History and Physical, chart, labs and discussed the procedure including the risks, benefits and alternatives for the proposed anesthesia with the patient or authorized representative who has indicated his/her understanding and acceptance.   Dental advisory given  Plan Discussed with: CRNA and Surgeon  Anesthesia Plan Comments:         Anesthesia Quick Evaluation

## 2014-04-27 NOTE — Transfer of Care (Signed)
Immediate Anesthesia Transfer of Care Note  Patient: Peggy Sanchez  Procedure(s) Performed: Procedure(s): LEFT ARTHROSCOPY KNEE (Left)  Patient Location: PACU  Anesthesia Type:General  Level of Consciousness: awake, alert , oriented and patient cooperative  Airway & Oxygen Therapy: Patient Spontanous Breathing and Patient connected to face mask oxygen  Post-op Assessment: Report given to RN and Post -op Vital signs reviewed and stable  Post vital signs: Reviewed and stable  Last Vitals:  Filed Vitals:   04/27/14 0621  BP: 98/65  Pulse: 53  Temp: 36.7 C  Resp: 16    Complications: No apparent anesthesia complications

## 2014-04-30 ENCOUNTER — Encounter (HOSPITAL_BASED_OUTPATIENT_CLINIC_OR_DEPARTMENT_OTHER): Payer: Self-pay | Admitting: Orthopedic Surgery

## 2014-05-04 ENCOUNTER — Encounter (HOSPITAL_BASED_OUTPATIENT_CLINIC_OR_DEPARTMENT_OTHER): Payer: Self-pay | Admitting: Orthopedic Surgery

## 2014-09-14 ENCOUNTER — Ambulatory Visit (INDEPENDENT_AMBULATORY_CARE_PROVIDER_SITE_OTHER): Payer: 59

## 2014-09-14 ENCOUNTER — Encounter: Payer: Self-pay | Admitting: Sports Medicine

## 2014-09-14 ENCOUNTER — Ambulatory Visit (INDEPENDENT_AMBULATORY_CARE_PROVIDER_SITE_OTHER): Payer: 59 | Admitting: Sports Medicine

## 2014-09-14 VITALS — BP 99/67 | HR 61 | Ht 65.0 in | Wt 126.0 lb

## 2014-09-14 DIAGNOSIS — M25552 Pain in left hip: Secondary | ICD-10-CM | POA: Diagnosis not present

## 2014-09-14 DIAGNOSIS — M25551 Pain in right hip: Secondary | ICD-10-CM

## 2014-09-14 NOTE — Assessment & Plan Note (Signed)
Worse with running, but no major changes in mileage, foot wear or surface. Exam shows fairly markedly hip abductor weakness. She does have only a minimal leg length discrepancy. She does have a positive C sign, indicating that the labrum may be an issue. X-rays, hip abductor rehabilitation, continue oral NSAIDs. Return to see me in 6 weeks, MR arthrogram if no better.

## 2014-09-14 NOTE — Patient Instructions (Signed)
Hip Rehabilitation Protocol:  1.  Side leg raises.  3x30 with no weight, then 3x15 with 2 lb ankle weight, then 3x15 with 5 lb ankle weight 2.  Standing hip rotation.  3x30 with no weight, then 3x15 with 2 lb ankle weight, then 3x15 with 5 lb ankle weight. 3.  Side step ups.  3x30 with no weight, then 3x15 with 5 lbs in backpack, then 3x15 with 10 lbs in backpack. 

## 2014-09-14 NOTE — Progress Notes (Signed)
  Subjective:    CC: Left hip pain  HPI: Peggy Sanchez is a pleasant 29 year old female, she comes in with a several week history of let hip pain, localized over the groin, lateral hip and buttock.  Moderate, persistent, no radiation, worse with running. No injuries, no mechanical symptoms.  Past medical history, Surgical history, Family history not pertinant except as noted below, Social history, Allergies, and medications have been entered into the medical record, reviewed, and no changes needed.   Review of Systems: No fevers, chills, night sweats, weight loss, chest pain, or shortness of breath.   Objective:    General: Well Developed, well nourished, and in no acute distress.  Neuro: Alert and oriented x3, extra-ocular muscles intact, sensation grossly intact.  HEENT: Normocephalic, atraumatic, pupils equal round reactive to light, neck supple, no masses, no lymphadenopathy, thyroid nonpalpable.  Skin: Warm and dry, no rashes. Cardiac: Regular rate and rhythm, no murmurs rubs or gallops, no lower extremity edema.  Respiratory: Clear to auscultation bilaterally. Not using accessory muscles, speaking in full sentences. Left Hip: ROM IR: 60 Deg, ER: 60 Deg, Flexion: 120 Deg, Extension: 100 Deg, Abduction: 45 Deg, Adduction: 45 Deg Strength IR: 5/5, ER: 5/5, Flexion: 5/5, Extension: 5/5, Abduction: 5/5, Adduction: 5/5 Pelvic alignment unremarkable to inspection and palpation. Standing hip rotation and gait without trendelenburg / unsteadiness. Greater trochanter without tenderness to palpation. No tenderness over piriformis. No SI joint tenderness and normal minimal SI movement. Positive flexion, adduction, internal rotation test, weak hip abductor's on the left side. Gait is overall unremarkable, she does turn her right foot out slightly. Left leg is slightly shorter.  Hip and pelvis x-rays are unremarkable.  Impression and Recommendations:

## 2015-03-24 NOTE — L&D Delivery Note (Signed)
Delivery Note  First Stage: Labor onset: 0953 Augmentation : none Analgesia /Anesthesia intrapartum: epidural AROM at 0951  Second Stage: Complete dilation at 1251 Onset of pushing at 1251 FHR second stage 130  Delivery of a viable female at 1258 by [name] in LOA position  No nuchal cord Cord double clamped after cessation of pulsation, cut by FOB Cord blood sample collected   Collection of cord blood donation - unable to collect, cord drained quckly before [facility] lab tech arrived  Third Stage: Placenta delivered via Tomasa BlaseSchultz intact with 3 VC @ 1304 - small marginal abruption noted Placenta disposition: L&D Uterine tone firm / bleeding minimal  1st degree perineal laceration identified  Anesthesia for repair: epidural Repair 3.0 vicryl to reapproximate edges & 4.0 vicryl for skin closure Est. Blood Loss (mL): 150  Complications: marginal placental abruption  Mom to postpartum.  Baby to Couplet care / Skin to Skin.  Newborn: Birth Weight: 8 lbs 2.5 oz  Apgar Scores: 8/9 Feeding planned: breast - attempting latch immediately after delivery  Peggy Sanchez, Peggy Sanchez, M  MSN, [name] 06/25/2015, 1:27 PM

## 2015-04-11 ENCOUNTER — Inpatient Hospital Stay (HOSPITAL_COMMUNITY): Admission: AD | Admit: 2015-04-11 | Payer: 59 | Source: Ambulatory Visit | Admitting: Obstetrics and Gynecology

## 2015-06-25 ENCOUNTER — Inpatient Hospital Stay (HOSPITAL_COMMUNITY): Payer: Managed Care, Other (non HMO) | Admitting: Anesthesiology

## 2015-06-25 ENCOUNTER — Inpatient Hospital Stay (HOSPITAL_COMMUNITY)
Admission: AD | Admit: 2015-06-25 | Discharge: 2015-06-26 | DRG: 774 | Disposition: A | Discharge status: Home / Self Care | Payer: Managed Care, Other (non HMO) | Source: Ambulatory Visit | Attending: Obstetrics & Gynecology | Admitting: Obstetrics & Gynecology

## 2015-06-25 ENCOUNTER — Encounter (HOSPITAL_COMMUNITY): Payer: Self-pay | Admitting: *Deleted

## 2015-06-25 DIAGNOSIS — Z87891 Personal history of nicotine dependence: Secondary | ICD-10-CM

## 2015-06-25 DIAGNOSIS — O4693 Antepartum hemorrhage, unspecified, third trimester: Secondary | ICD-10-CM

## 2015-06-25 DIAGNOSIS — O458X3 Other premature separation of placenta, third trimester: Secondary | ICD-10-CM | POA: Diagnosis present

## 2015-06-25 DIAGNOSIS — O4593 Premature separation of placenta, unspecified, third trimester: Secondary | ICD-10-CM | POA: Diagnosis present

## 2015-06-25 DIAGNOSIS — O4690 Antepartum hemorrhage, unspecified, unspecified trimester: Secondary | ICD-10-CM | POA: Diagnosis present

## 2015-06-25 DIAGNOSIS — Z3A39 39 weeks gestation of pregnancy: Secondary | ICD-10-CM

## 2015-06-25 DIAGNOSIS — O209 Hemorrhage in early pregnancy, unspecified: Secondary | ICD-10-CM | POA: Diagnosis present

## 2015-06-25 HISTORY — DX: Antepartum hemorrhage, unspecified, third trimester: O46.93

## 2015-06-25 LAB — CBC
HCT: 33.6 % — ABNORMAL LOW (ref 36.0–46.0)
Hemoglobin: 11.5 g/dL — ABNORMAL LOW (ref 12.0–15.0)
MCH: 30.8 pg (ref 26.0–34.0)
MCHC: 34.2 g/dL (ref 30.0–36.0)
MCV: 90.1 fL (ref 78.0–100.0)
Platelets: 319 10*3/uL (ref 150–400)
RBC: 3.73 MIL/uL — ABNORMAL LOW (ref 3.87–5.11)
RDW: 13.1 % (ref 11.5–15.5)
WBC: 8.5 10*3/uL (ref 4.0–10.5)

## 2015-06-25 LAB — APTT: aPTT: 28 seconds (ref 24–37)

## 2015-06-25 LAB — COMPREHENSIVE METABOLIC PANEL
ALT: 10 U/L — ABNORMAL LOW (ref 14–54)
AST: 18 U/L (ref 15–41)
Albumin: 2.8 g/dL — ABNORMAL LOW (ref 3.5–5.0)
Alkaline Phosphatase: 155 U/L — ABNORMAL HIGH (ref 38–126)
Anion gap: 4 — ABNORMAL LOW (ref 5–15)
BUN: 8 mg/dL (ref 6–20)
CO2: 25 mmol/L (ref 22–32)
Calcium: 8.5 mg/dL — ABNORMAL LOW (ref 8.9–10.3)
Chloride: 107 mmol/L (ref 101–111)
Creatinine, Ser: 0.64 mg/dL (ref 0.44–1.00)
GFR calc Af Amer: >60 mL/min (ref >60)
GFR calc non Af Amer: >60 mL/min (ref >60)
Glucose, Bld: 76 mg/dL (ref 65–99)
Potassium: 3.5 mmol/L (ref 3.5–5.1)
Sodium: 136 mmol/L (ref 135–145)
Total Bilirubin: 0.4 mg/dL (ref 0.3–1.2)
Total Protein: 6.3 g/dL — ABNORMAL LOW (ref 6.5–8.1)

## 2015-06-25 LAB — SAVE SMEAR: Smear Review: NONE SEEN

## 2015-06-25 LAB — PROTIME-INR
INR: 1.02 (ref 0.00–1.49)
Prothrombin Time: 13.6 seconds (ref 11.6–15.2)

## 2015-06-25 LAB — FIBRINOGEN: Fibrinogen: 532 mg/dL — ABNORMAL HIGH (ref 204–475)

## 2015-06-25 LAB — PLATELET COUNT: Platelets: 327 10*3/uL (ref 150–400)

## 2015-06-25 LAB — RPR: RPR Ser Ql: NONREACTIVE

## 2015-06-25 MED ORDER — PHENYLEPHRINE 40 MCG/ML (10ML) SYRINGE FOR IV PUSH (FOR BLOOD PRESSURE SUPPORT)
80.0000 ug | PREFILLED_SYRINGE | INTRAVENOUS | Status: DC | PRN
  Administered 2015-06-25 (×2): 80 ug via INTRAVENOUS
  Filled 2015-06-25 (×2): qty 20
  Filled 2015-06-25: qty 2

## 2015-06-25 MED ORDER — ACETAMINOPHEN 325 MG PO TABS: 650.0000 mg | ORAL_TABLET | ORAL | Status: DC | PRN

## 2015-06-25 MED ORDER — ONDANSETRON HCL 4 MG/2ML IJ SOLN: 4.0000 mg | INTRAMUSCULAR | Status: DC | PRN

## 2015-06-25 MED ORDER — EPHEDRINE 5 MG/ML INJ
10.0000 mg | INTRAVENOUS | Status: DC | PRN
  Filled 2015-06-25: qty 2

## 2015-06-25 MED ORDER — DOCUSATE SODIUM 100 MG PO CAPS
100.0000 mg | ORAL_CAPSULE | Freq: Two times a day (BID) | ORAL | Status: DC
  Administered 2015-06-25 – 2015-06-26 (×2): 100 mg via ORAL
  Filled 2015-06-25 (×2): qty 1

## 2015-06-25 MED ORDER — PRENATAL MULTIVITAMIN CH
1.0000 | ORAL_TABLET | Freq: Every day | ORAL | Status: DC
  Filled 2015-06-25: qty 1

## 2015-06-25 MED ORDER — WITCH HAZEL-GLYCERIN EX PADS: 1.0000 "application " | MEDICATED_PAD | CUTANEOUS | Status: DC | PRN

## 2015-06-25 MED ORDER — OXYCODONE-ACETAMINOPHEN 5-325 MG PO TABS: 1.0000 | ORAL_TABLET | ORAL | Status: DC | PRN

## 2015-06-25 MED ORDER — SENNOSIDES-DOCUSATE SODIUM 8.6-50 MG PO TABS
2.0000 | ORAL_TABLET | ORAL | Status: DC
  Administered 2015-06-26: 2 via ORAL
  Filled 2015-06-25: qty 2

## 2015-06-25 MED ORDER — FENTANYL 2.5 MCG/ML BUPIVACAINE 1/10 % EPIDURAL INFUSION (WH - ANES)
14.0000 mL/h | INTRAMUSCULAR | Status: DC | PRN
  Administered 2015-06-25: 14 mL/h via EPIDURAL
  Filled 2015-06-25: qty 125

## 2015-06-25 MED ORDER — IBUPROFEN 600 MG PO TABS
600.0000 mg | ORAL_TABLET | Freq: Four times a day (QID) | ORAL | Status: DC
  Administered 2015-06-25 – 2015-06-26 (×5): 600 mg via ORAL
  Filled 2015-06-25 (×5): qty 1

## 2015-06-25 MED ORDER — DIBUCAINE 1 % RE OINT: 1.0000 "application " | TOPICAL_OINTMENT | RECTAL | Status: DC | PRN

## 2015-06-25 MED ORDER — OXYCODONE-ACETAMINOPHEN 5-325 MG PO TABS: 2.0000 | ORAL_TABLET | ORAL | Status: DC | PRN

## 2015-06-25 MED ORDER — ONDANSETRON HCL 4 MG PO TABS: 4.0000 mg | ORAL_TABLET | ORAL | Status: DC | PRN

## 2015-06-25 MED ORDER — LIDOCAINE HCL (PF) 1 % IJ SOLN
INTRAMUSCULAR | Status: DC | PRN
  Administered 2015-06-25 (×2): 4 mL via EPIDURAL

## 2015-06-25 MED ORDER — LIDOCAINE HCL (PF) 1 % IJ SOLN
30.0000 mL | INTRAMUSCULAR | Status: DC | PRN
  Filled 2015-06-25: qty 30

## 2015-06-25 MED ORDER — LACTATED RINGERS IV SOLN: 500.0000 mL | INTRAVENOUS | Status: DC | PRN

## 2015-06-25 MED ORDER — LACTATED RINGERS IV SOLN
500.0000 mL | Freq: Once | INTRAVENOUS | Status: AC
  Administered 2015-06-25: 500 mL via INTRAVENOUS

## 2015-06-25 MED ORDER — DIPHENHYDRAMINE HCL 25 MG PO CAPS: 25.0000 mg | ORAL_CAPSULE | Freq: Four times a day (QID) | ORAL | Status: DC | PRN

## 2015-06-25 MED ORDER — FAMOTIDINE 20 MG PO TABS
20.0000 mg | ORAL_TABLET | Freq: Two times a day (BID) | ORAL | Status: DC
  Filled 2015-06-25 (×3): qty 1

## 2015-06-25 MED ORDER — CITRIC ACID-SODIUM CITRATE 334-500 MG/5ML PO SOLN: 30.0000 mL | ORAL | Status: DC | PRN

## 2015-06-25 MED ORDER — LACTATED RINGERS IV SOLN
INTRAVENOUS | Status: DC
  Administered 2015-06-25 (×2): via INTRAVENOUS

## 2015-06-25 MED ORDER — OXYCODONE-ACETAMINOPHEN 5-325 MG PO TABS
2.0000 | ORAL_TABLET | ORAL | Status: DC | PRN
Start: 2015-06-25 — End: 2015-06-26

## 2015-06-25 MED ORDER — PHENYLEPHRINE 40 MCG/ML (10ML) SYRINGE FOR IV PUSH (FOR BLOOD PRESSURE SUPPORT): 80.0000 ug | PREFILLED_SYRINGE | INTRAVENOUS | Status: DC | PRN

## 2015-06-25 MED ORDER — ZOLPIDEM TARTRATE 5 MG PO TABS: 5.0000 mg | ORAL_TABLET | Freq: Every evening | ORAL | Status: DC | PRN

## 2015-06-25 MED ORDER — EPHEDRINE 5 MG/ML INJ: 10.0000 mg | INTRAVENOUS | Status: DC | PRN

## 2015-06-25 MED ORDER — SIMETHICONE 80 MG PO CHEW: 80.0000 mg | CHEWABLE_TABLET | ORAL | Status: DC | PRN

## 2015-06-25 MED ORDER — DIPHENHYDRAMINE HCL 50 MG/ML IJ SOLN: 12.5000 mg | INTRAMUSCULAR | Status: DC | PRN

## 2015-06-25 MED ORDER — TETANUS-DIPHTH-ACELL PERTUSSIS 5-2.5-18.5 LF-MCG/0.5 IM SUSP: 0.5000 mL | Freq: Once | INTRAMUSCULAR | Status: DC

## 2015-06-25 MED ORDER — OXYTOCIN BOLUS FROM INFUSION
500.0000 mL | INTRAVENOUS | Status: DC
  Administered 2015-06-25: 500 mL via INTRAVENOUS

## 2015-06-25 MED ORDER — BENZOCAINE-MENTHOL 20-0.5 % EX AERO
1.0000 "application " | INHALATION_SPRAY | CUTANEOUS | Status: DC | PRN
  Administered 2015-06-25: 1 via TOPICAL
  Filled 2015-06-25: qty 56

## 2015-06-25 MED ORDER — OXYTOCIN 10 UNIT/ML IJ SOLN
2.5000 [IU]/h | INTRAVENOUS | Status: DC
  Filled 2015-06-25: qty 4

## 2015-06-25 NOTE — Lactation Note (Signed)
This note was copied from a baby's chart. Lactation Consultation Note Initial visit at 8 hours of age.  Mom reports history of breast augmentation with repair in 2010.  Mom reports inadequate breast tissue prior to surgery.  Mom reports taking domperidone with previous children to provide minimal breast milk.  Discussed using DEBP after feedings for 15 minutes followed by hand expression.  Mom to post pump 6-8 times daily. Baby latched well to left breast with chin tug.  Mom denies pain and audible swallows observed.  Rn set up DEBP at bedside.  [facility] given and discussed.  Encouraged to feed with early cues on demand.  Early newborn behavior discussed.  Hand expression demonstrated by mom with colostrum visible.  Mom to call for assist as needed.    Patient Name: Peggy [name] ZOXWR'UToday's Date: 06/25/2015 Reason for consult: Initial assessment;Breast surgery   Maternal Data Has patient been taught Hand Expression?: Yes Does the patient have breastfeeding experience prior to this delivery?: Yes  Feeding Feeding Type: Breast Fed Length of feed: 10 min  LATCH Score/Interventions Latch: Grasps breast easily, tongue down, lips flanged, rhythmical sucking.  Audible Swallowing: A few with stimulation Intervention(s): Skin to skin;Hand expression;Alternate breast massage  Type of Nipple: Everted at rest and after stimulation  Comfort (Breast/Nipple): Soft / non-tender     Hold (Positioning): No assistance needed to correctly position infant at breast. Intervention(s): Breastfeeding basics reviewed;Support Pillows;Position options;Skin to skin  LATCH Score: 9  Lactation Tools Discussed/Used Initiated by:: RN Date initiated:: 06/25/15   Consult Status Consult Status: Follow-up Date: 06/26/15 Follow-up type: In-patient    Shoptaw, Arvella MerlesJana Lynn 06/25/2015, 9:30 PM

## 2015-06-25 NOTE — Progress Notes (Signed)
Patient ID: [name], female   DOB: [birth year], 30 y.o.   MRN: [id number] TC from Marcelino DusterMichelle, RN reporting contractions every 1 min - requesting [name] re-check pt's cervix S: Doing well, pain well-controlled with an epidural. Minimal vaginal bleeding with clear fluid.   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

## 2015-06-25 NOTE — Anesthesia Procedure Notes (Signed)
Epidural Patient location during procedure: OB Start time: 06/25/2015 9:31 AM  Staffing Anesthesiologist: Mal AmabileFOSTER, Aeralyn Barna Performed by: anesthesiologist   Preanesthetic Checklist Completed: patient identified, site marked, surgical consent, pre-op evaluation, timeout performed, IV checked, risks and benefits discussed and monitors and equipment checked  Epidural Patient position: sitting Prep: site prepped and draped and DuraPrep Patient monitoring: continuous pulse ox and blood pressure Approach: midline Location: L3-L4 Injection technique: LOR air  Needle:  Needle type: Tuohy  Needle gauge: 17 G Needle length: 9 cm and 9 Needle insertion depth: 4 cm Catheter type: closed end flexible Catheter size: 19 Gauge Catheter at skin depth: 9 cm Test dose: negative and Other  Assessment Events: blood not aspirated, injection not painful, no injection resistance, negative IV test and no paresthesia  Additional Notes Patient identified. Risks and benefits discussed including failed block, incomplete  Pain control, post dural puncture headache, nerve damage, paralysis, blood pressure Changes, nausea, vomiting, reactions to medications-both toxic and allergic and post Partum back pain. All questions were answered. Patient expressed understanding and wished to proceed. Sterile technique was used throughout procedure. Epidural site was Dressed with sterile barrier dressing. No paresthesias, signs of intravascular injection Or signs of intrathecal spread were encountered.  Patient was more comfortable after the epidural was dosed. Please see RN's note for documentation of vital signs and FHR which are stable.

## 2015-06-25 NOTE — Anesthesia Preprocedure Evaluation (Signed)
Anesthesia Evaluation  Patient identified by MRN, date of birth, ID band Patient awake    Reviewed: Allergy & Precautions, Patient's Chart, lab work & pertinent test results  Airway Mallampati: II  TM Distance: >3 FB Neck ROM: Full    Dental no notable dental hx. (+) Teeth Intact   Pulmonary former smoker,    Pulmonary exam normal breath sounds clear to auscultation       Cardiovascular negative cardio ROS Normal cardiovascular exam Rhythm:Regular Rate:Normal     Neuro/Psych negative neurological ROS  negative psych ROS   GI/Hepatic Neg liver ROS, GERD  ,  Endo/Other  negative endocrine ROS  Renal/GU negative Renal ROS  negative genitourinary   Musculoskeletal negative musculoskeletal ROS (+)   Abdominal   Peds  Hematology  (+) anemia ,   Anesthesia Other Findings   Reproductive/Obstetrics (+) Pregnancy Placental abruption                             Anesthesia Physical Anesthesia Plan  ASA: II  Anesthesia Plan: Epidural   Post-op Pain Management:    Induction:   Airway Management Planned: Natural Airway  Additional Equipment:   Intra-op Plan:   Post-operative Plan:   Informed Consent: I have reviewed the patients History and Physical, chart, labs and discussed the procedure including the risks, benefits and alternatives for the proposed anesthesia with the patient or authorized representative who has indicated his/her understanding and acceptance.     Plan Discussed with: Anesthesiologist  Anesthesia Plan Comments:         Anesthesia Quick Evaluation

## 2015-06-25 NOTE — Progress Notes (Addendum)
Patient ID: Peggy Sanchez, female   DOB: 01-27-1986, 30 y.o.   MRN: [id number] Subjective: Peggy Sanchez is a 30 y.o. G3P2002 at 3853w3d by LMP admitted for vaginal bleeding with clots - suspected abruption  Objective: Filed Vitals:   06/25/15 0833 06/25/15 0931 06/25/15 0935 06/25/15 0945  BP: 102/68 110/71 113/58 103/71  Pulse: 60 57 62 63  Temp: 97.5 F (36.4 C)     TempSrc: Oral     Resp: 18 18 18 18   Height: 5\' 5"  (1.651 m)     Weight: 69.854 kg (154 lb)     SpO2:  100% 100% 100%      Total I/O In: -  Out: 150 [Urine:150]   FHT:  FHR: 140 bpm, variability: moderate,  accelerations:  Present,  decelerations:  Present occ.variables UC:   regular, every 5-6 minutes SVE:   Dilation: 4.5 Effacement (%): 80 Station: -2 Exam by: Raelyn Moraolitta Peggy Sanchez, [name] AROM - clear fluid initially, small blood clots from vagina  Labs:   Recent Labs  06/25/15 0804 06/25/15 0805  WBC  --  8.5  HGB  --  11.5*  HCT  --  33.6*  PLT 327 319    Assessment / Plan: IOL for Suspected placental abruption  Labor: Progressing normally Preeclampsia:  n/a Fetal Wellbeing:  Category I Pain Control:  Epidural  Re-evaluate in 1-2 hours / assess for need for pitocin at that time Anticipated MOD:  NSVD  Kenard GowerAWSON, Luvenia Cranford, M, MSN, [name] 06/25/2015, 10:02 AM

## 2015-06-25 NOTE — Progress Notes (Signed)
Patient ID: Shawn StallSarah Schwandt, female   DOB: 12/29/1985, 30 y.o.   MRN: [id number] S: Doing well, pain well-controlled with an epidural. Some (+) pelvic pressure "like having to poop" with some contractions.   O: Filed Vitals:   06/25/15 1010 06/25/15 1030 06/25/15 1045 06/25/15 1055  BP: 104/65 100/64    Pulse: 77 60 63 56  Temp:  97.8 F (36.6 C)    TempSrc:  Oral    Resp: 18 18    Height:      Weight:      SpO2: 100% 100% 100% 100%     FHT:  FHR: 135 bpm, variability: moderate,  accelerations:  Present,  decelerations:  Present occ variable UC:   regular, every 1-2 minutes SVE:   Dilation: 9.5 Effacement (%): 100 Station: +1 Exam by:: Mickie Kozikowski, [name]    A / P: Induction of labor due to abruptio placentae,  progressing well   Fetal Wellbeing:  Category I Pain Control:  Epidural  Anticipated MOD:  NSVD  Delbert Vu, M 06/25/2015, 12:36 PM

## 2015-06-25 NOTE — H&P (Signed)
  OB ADMISSION/ HISTORY & PHYSICAL:  Admission Date: 06/25/2015  7:36 AM  Admit Diagnosis: 39 weeks / third trimester bleeding - possible abruption   [name] is a 30 y.o. female presenting for onset bright red heavy bleeding & passing clots.  Prenatal History: [id number]   EDC : 06/29/2015 [facility]  Primary Ob Provider: Fredric MareBailey [name] Prenatal course uncomplicated  Prenatal Labs: ABO, Rh:  A negative Antibody:  negative Rubella:   Immune RPR:   NR HBsAg:   Negative HIV:   NR GTT: NL GBS:   negative current pregnancy  Posterior placenta - no hx low-lying in pregnancy / no early bleeding episode GERD in pregnancy managed with Zantac Hemorrhoids with occasional flares  Medical / Surgical History :  Past medical history:  Past Medical History  Diagnosis Date  . Medial meniscus tear 04/2014    left knee  . Dental crowns present   . Interstitial cystitis   . Difficult intravenous access     Past surgical history:  Past Surgical History  Procedure Laterality Date  . Cystoscopy with hydrodistension and biopsy  05/15/2010    bladder instillation therapy  . Inguinal hernia repair Bilateral     as a child  . Breast enhancement surgery Bilateral   . Wisdom tooth extraction    . Knee arthroscopy Left 04/27/2014    Procedure: LEFT KNEE ARTHROSCOPY, DEBRIDEMENT OF CHONDROMALACIA  AND EXCISION OF FIBROUS BANDS;  Surgeon: Nestor LewandowskyFrank J Rowan, MD;  Location: [facility];  Service: Orthopedics;  Laterality: Left;  . Chondroplasty Left 04/27/2014    Procedure: CHONDROPLASTY;  Surgeon: Nestor LewandowskyFrank J Rowan, MD;  Location: [facility];  Service: Orthopedics;  Laterality: Left;    Family History: No family history on file.   Social History:  reports that she quit smoking about 8 years ago. She has never used smokeless tobacco. She reports that she drinks alcohol. She reports that she does not use illicit drugs.   Allergies: Review of  patient's allergies indicates no known allergies.    Current Medications at time of admission:  Prior to Admission medications   Medication Sig Start Date End Date Taking? Authorizing Provider  Prenatal Vit-Fe Fumarate-FA (MULTIVITAMIN-PRENATAL) 27-0.8 MG TABS Take 1 tablet by mouth daily at 12 noon.    Historical Provider, MD           Review of Systems: Active FM Not feeling any contractions No LOF Bleeding bright red - passing clots larger than quarter  Physical Exam:  VS: 97.6 - 70-18 - 101/72 General: alert and oriented, appears anxious  Heart: RRR Lungs: Clear lung fields Abdomen: Gravid, soft and non-tender, non-distended / uterus: gravid, soft, non-tender Extremities: no edema Spec exam: moderate amount red blood in vault Genitalia / VE: 3-4 cm / 80% / vtx -2 FHR: baseline rate 145 / variability moderate / accelerations one 10x10 / no decelerations TOCO: UI with mild ctx  Assessment: [redacted] weeks gestation Third trimester bleeding -likely abruption FHR category 1   Plan:  Admit Labs with DIC panel Continuous EFM Cautious attempt at vaginal delivery - plan AROM after admission Dr Juliene PinaMody here =  admission / plan of care   [name], Darril Patriarca [name], MSN, [facility] 06/25/2015, 7:48 AM

## 2015-06-25 NOTE — MAU Note (Signed)
Pt woke up in a puddle of blood passing a few clots.  Called on call provider instructed to come in asap.  No pain, denies LOF.

## 2015-06-25 NOTE — Anesthesia Postprocedure Evaluation (Signed)
Anesthesia Post Note  Patient: Peggy Sanchez  Procedure(s) Performed: * No procedures listed *  Patient location during evaluation: Mother Baby Anesthesia Type: Epidural Level of consciousness: awake and alert and oriented Pain management: satisfactory to patient Vital Signs Assessment: post-procedure vital signs reviewed and stable Respiratory status: spontaneous breathing and nonlabored ventilation Cardiovascular status: stable Postop Assessment: no headache, no backache, no signs of nausea or vomiting, adequate PO intake and patient able to bend at knees (patient up walking) Anesthetic complications: no    Last Vitals:  Filed Vitals:   06/25/15 1525 06/25/15 1710  BP: 105/66 116/69  Pulse: 64 55  Temp: 36.7 C   Resp: 20 20    Last Pain:  Filed Vitals:   06/25/15 1716  PainSc: 1                  Elner Seifert

## 2015-06-26 ENCOUNTER — Encounter (HOSPITAL_COMMUNITY): Payer: Self-pay | Admitting: Student

## 2015-06-26 LAB — CBC
HEMATOCRIT: 32 % — AB (ref 36.0–46.0)
HEMOGLOBIN: 11.1 g/dL — AB (ref 12.0–15.0)
MCH: 30.9 pg (ref 26.0–34.0)
MCHC: 34.7 g/dL (ref 30.0–36.0)
MCV: 89.1 fL (ref 78.0–100.0)
Platelets: 223 10*3/uL (ref 150–400)
RBC: 3.59 MIL/uL — ABNORMAL LOW (ref 3.87–5.11)
RDW: 13.1 % (ref 11.5–15.5)
WBC: 11.6 10*3/uL — AB (ref 4.0–10.5)

## 2015-06-26 MED ORDER — IBUPROFEN 600 MG PO TABS: 600.0000 mg | ORAL_TABLET | Freq: Four times a day (QID) | ORAL | Status: DC

## 2015-06-26 NOTE — Discharge Summary (Signed)
Obstetric Discharge Summary  Reason for Admission: bleeding in third trimester - marginal abruption - latent labor Prenatal Procedures: none Intrapartum Procedures: spontaneous vaginal delivery and epidural Postpartum Procedures: none Complications-Operative and Postpartum: 1st degree perineal laceration HEMOGLOBIN  Date Value Ref Range Status  06/26/2015 11.1* 12.0 - 15.0 g/dL Final   HCT  Date Value Ref Range Status  06/26/2015 32.0* 36.0 - 46.0 % Final    Physical Exam:  General: alert, cooperative and no distress Lochia: appropriate Uterine Fundus: firm Incision: healing well DVT Evaluation: No evidence of DVT seen on physical exam.  Discharge Diagnoses: Term Pregnancy-delivered  / marginal abruption  Discharge Information: Date: 06/26/2015 Activity: pelvic rest Diet: routine Medications: PNV and Ibuprofen Condition: stable Instructions: refer to practice specific booklet Discharge to: home Follow-up Information    Follow up with Peggy Sanchez, Khylei Wilms, [name]. Schedule an appointment as soon as possible for a visit in 6 weeks.   Specialty:  Obstetrics and Gynecology   Contact information:   [address] [place] [address] [phone]       Newborn Data: Live born female  Birth Weight: 8 lb 2.5 oz (3700 g) APGAR: 8, 9  Home with mother.  Peggy Sanchez, Elara Cocke 06/26/2015, 9:11 AM

## 2015-06-26 NOTE — Progress Notes (Signed)
PPD 1 SVD / abruption  S:  Reports feeling well - desires early DC             Tolerating po/ No nausea or vomiting             Bleeding is light             Pain controlled with Tylenol and Motrin             Up ad lib / ambulatory / voiding QS  Newborn breast feeding  / Circumcision planned  O:               VS: BP 103/55 mmHg  Pulse 52  Temp(Src) 98.1 F (36.7 C) (Oral)  Resp 20  Ht 5\' 5"  (1.651 m)  Wt 69.854 kg (154 lb)  BMI 25.63 kg/m2  SpO2 100%  Breastfeeding? Unknown   LABS:              Recent Labs  06/25/15 0805 06/26/15 0502  WBC 8.5 11.6*  HGB 11.5* 11.1*  PLT 319 223               Blood type: --/--/A NEG (04/04 0804) / newborn A negative - no rhogam indicated              Physical Exam:             Alert and oriented X3  Abdomen: soft, non-tender, non-distended              Fundus: firm, non-tender, U-1  Perineum: no edema  Lochia: light  Extremities: no edema, no calf pain or tenderness    A: PPD # 1 with 1st degree repair / abruption   Doing well - stable status  P: Routine post partum orders  DC home - may room-in if newborn remains today  Peggy Sanchez, Peggy Sanchez [name], MSN, FACNM 06/26/2015, 8:20 AM

## 2015-06-26 NOTE — Lactation Note (Signed)
This note was copied from a baby's chart. Lactation Consultation Note; experienced BF mom with history of breast augmentation and low milk supply with 2 previous babies. Reports she had little breast tissue before surgery. Reports baby has been latching well LS 9 by RN. Pumping as I went into room- few drops obtained. Baby asleep at her side. Has Medela pump for home. Asking about supplementing- encouraged to watch baby and see when milk supply increases. Can give a little formula 15-30 ml after nursing if still hungry as milk supply increases. Plans to take Domperidone. She has taken this in the past and feels it increases her milk supply significantly. No questions at present. Plans to come to [facility] for pre/post weights. To call prn  Patient Name: Peggy [name] [id number]'XToday's Date: 06/26/2015 Reason for consult: Follow-up assessment;Breast surgery   Maternal Data Formula Feeding for Exclusion: No Has patient been taught Hand Expression?: Yes Does the patient have breastfeeding experience prior to this delivery?: Yes  Feeding    LATCH Score/Interventions                      Lactation Tools Discussed/Used WIC Program: No   Consult Status Consult Status: Complete    Peggy Sanchez, Peggy Sanchez 06/26/2015, 11:16 AM

## 2015-06-26 NOTE — Progress Notes (Signed)
Patient in shower and infant hearing screen in process - return lunch for rounds  Peggy Sanchez [name] [facility]

## 2015-06-28 LAB — TYPE AND SCREEN
ABO/RH(D): A NEG
Antibody Screen: POSITIVE
DAT, IgG: NEGATIVE
Unit division: 0
Unit division: 0

## 2015-08-07 ENCOUNTER — Encounter: Payer: Self-pay | Admitting: *Deleted

## 2015-08-14 ENCOUNTER — Ambulatory Visit (INDEPENDENT_AMBULATORY_CARE_PROVIDER_SITE_OTHER): Payer: Managed Care, Other (non HMO) | Admitting: *Deleted

## 2015-08-14 DIAGNOSIS — I8393 Asymptomatic varicose veins of bilateral lower extremities: Secondary | ICD-10-CM

## 2015-08-14 NOTE — Progress Notes (Signed)
X=.3% Sotradecol administered with a 27g butterfly.  Patient received a total of 6cc.  Treated majority of her concerns with one syringe. Afew retics in the front of both calves and the rest were scattered, small spiders. Tol well. Easy access. Follow prn.  Photos: No.  Compression stockings applied: Yes.

## 2015-08-15 ENCOUNTER — Encounter: Payer: Self-pay | Admitting: Vascular Surgery

## 2015-09-24 IMAGING — CR DG KNEE 1-2V*R*
4 series · 4 of 4 positions shown · non-contrast
Comparison: Radiographs of the left knee dated 12/06/2013

CLINICAL DATA: Left knee pain.

EXAM:
RIGHT KNEE - 1-2 VIEW

[view not recorded (1 of 4)]
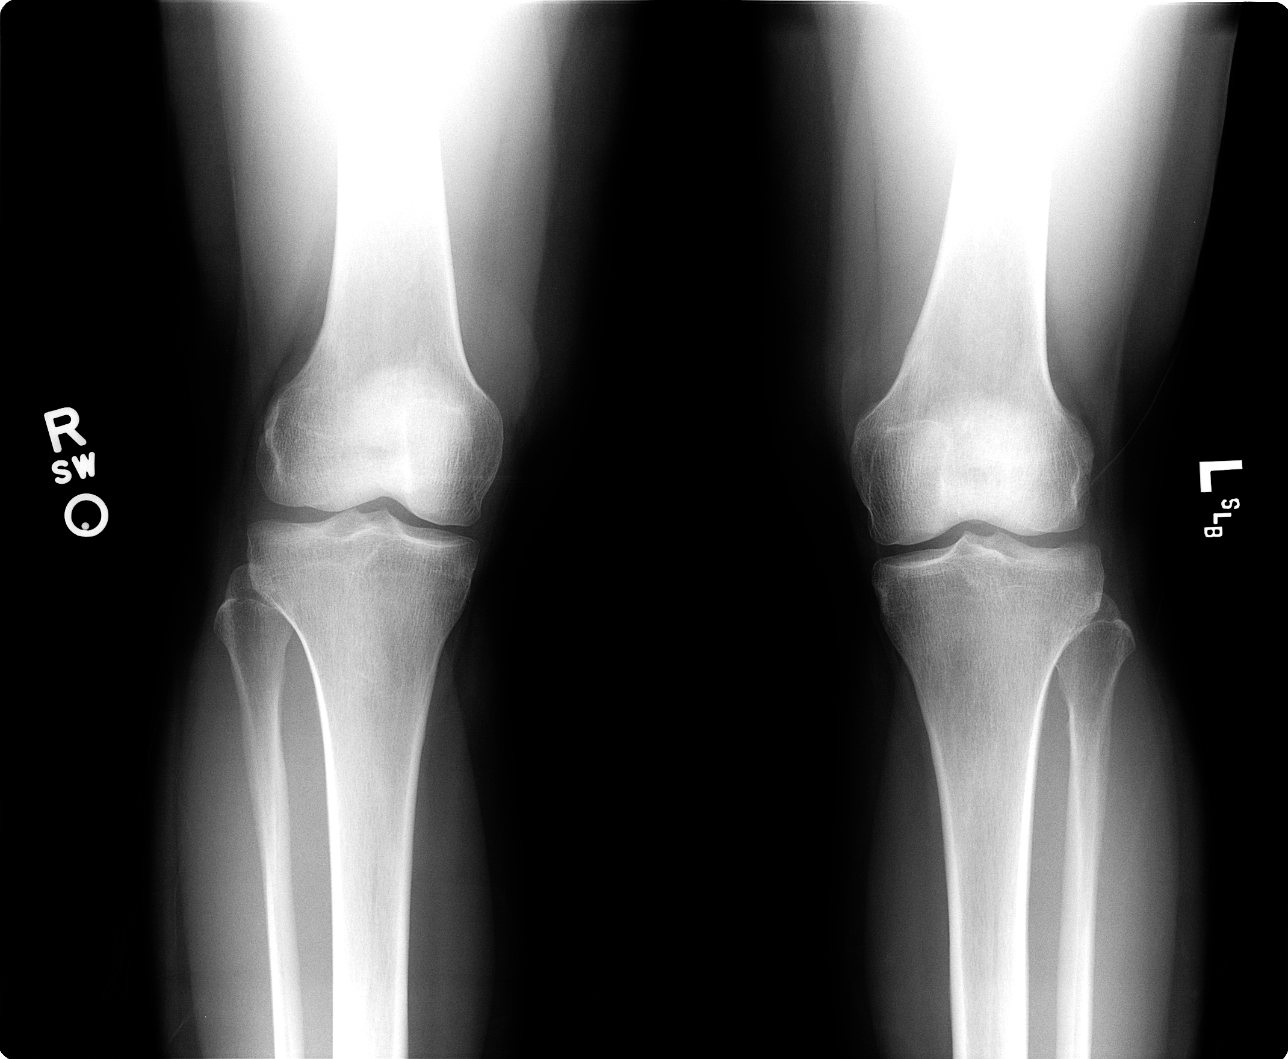

[view not recorded (2 of 4)]
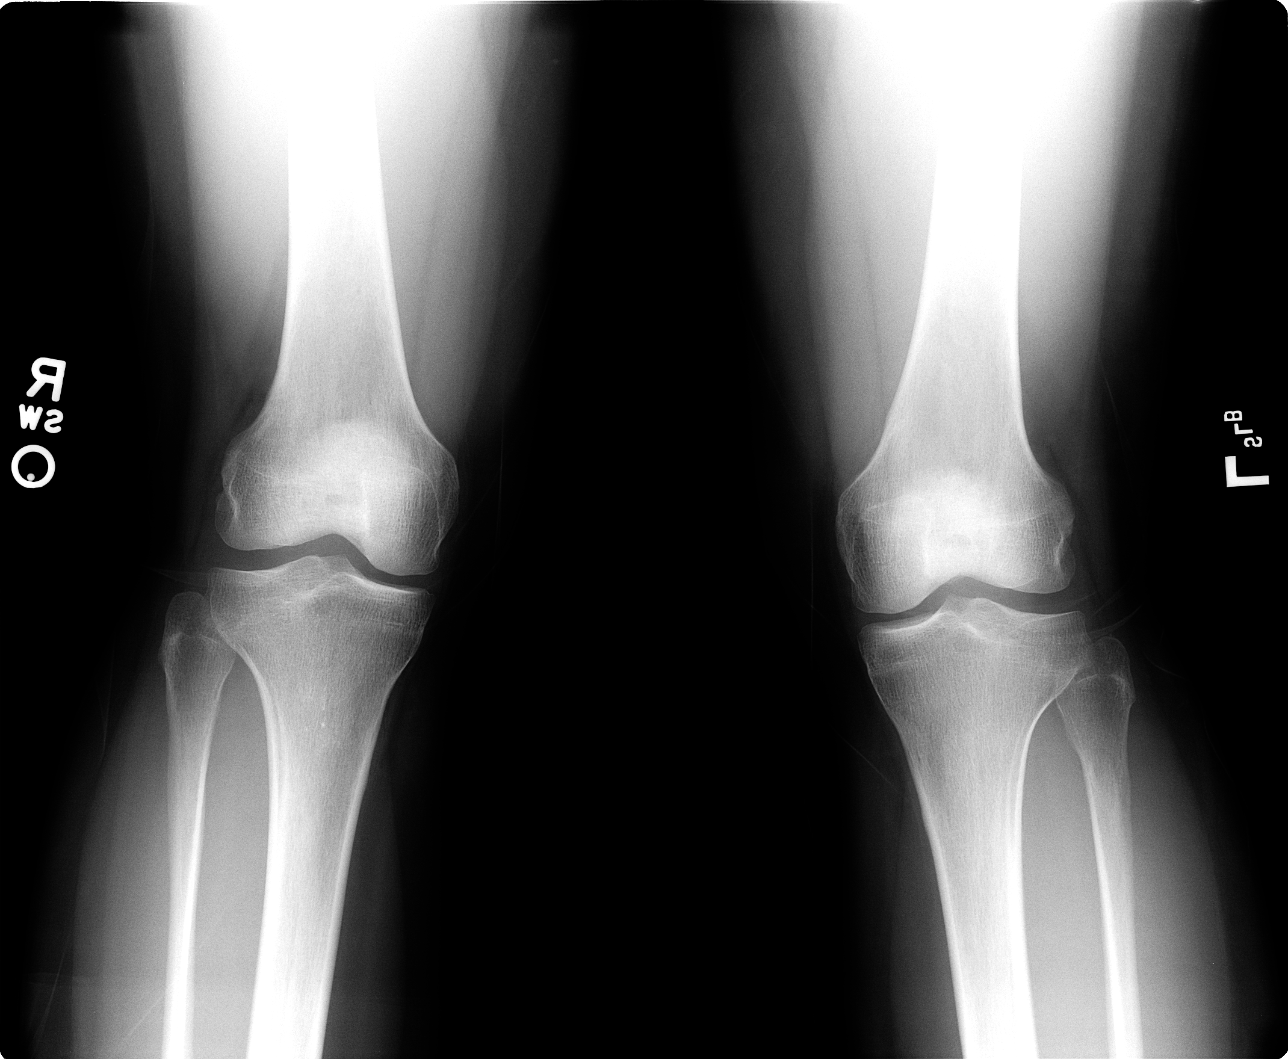

[view not recorded (3 of 4)]
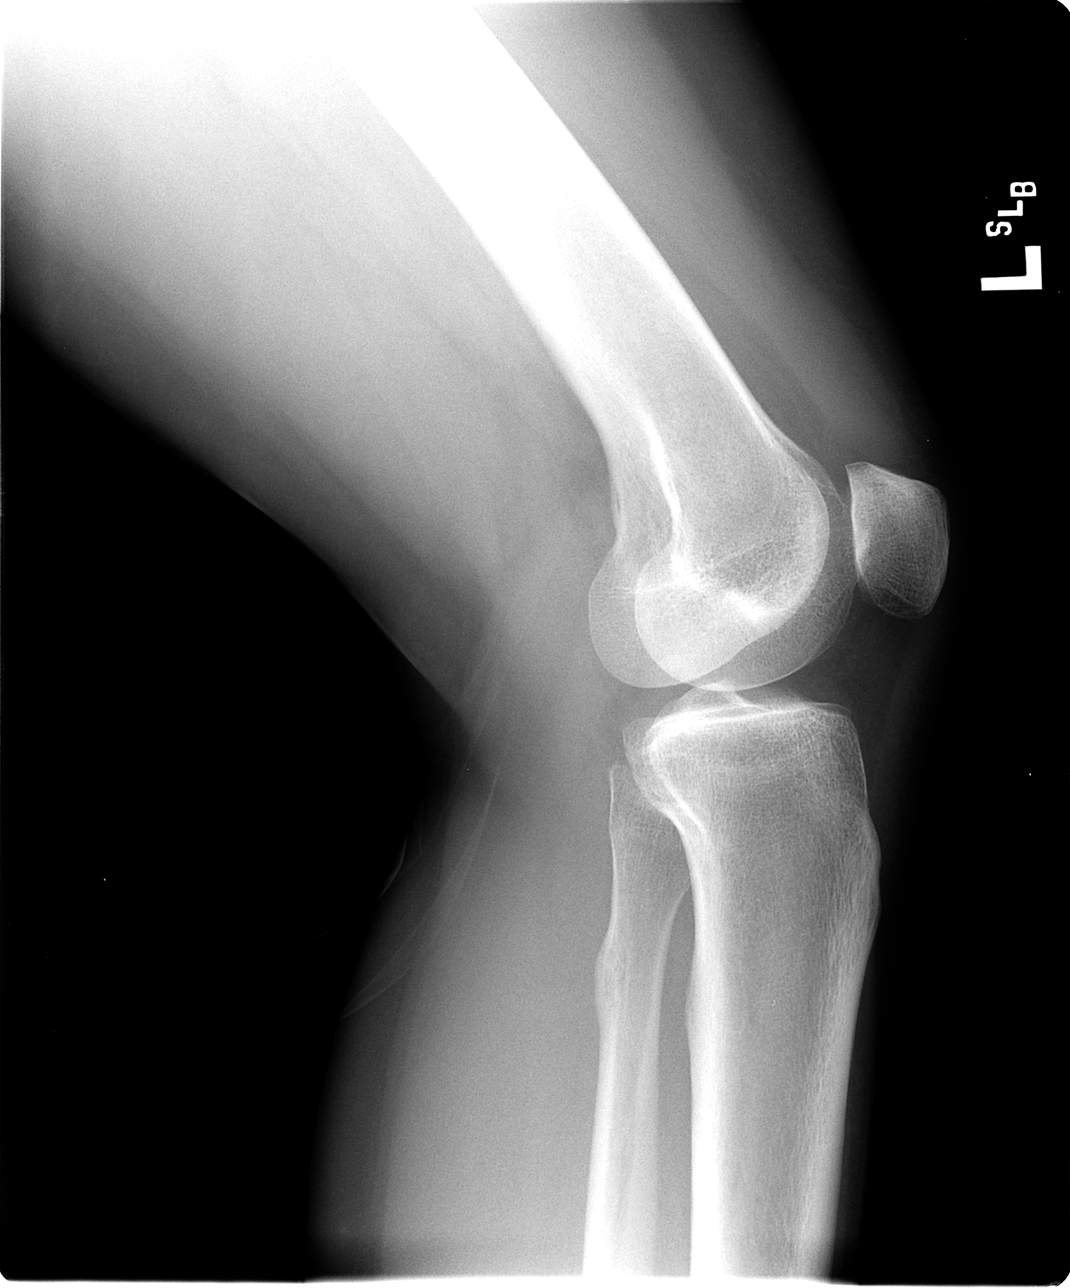

[view not recorded (4 of 4)]
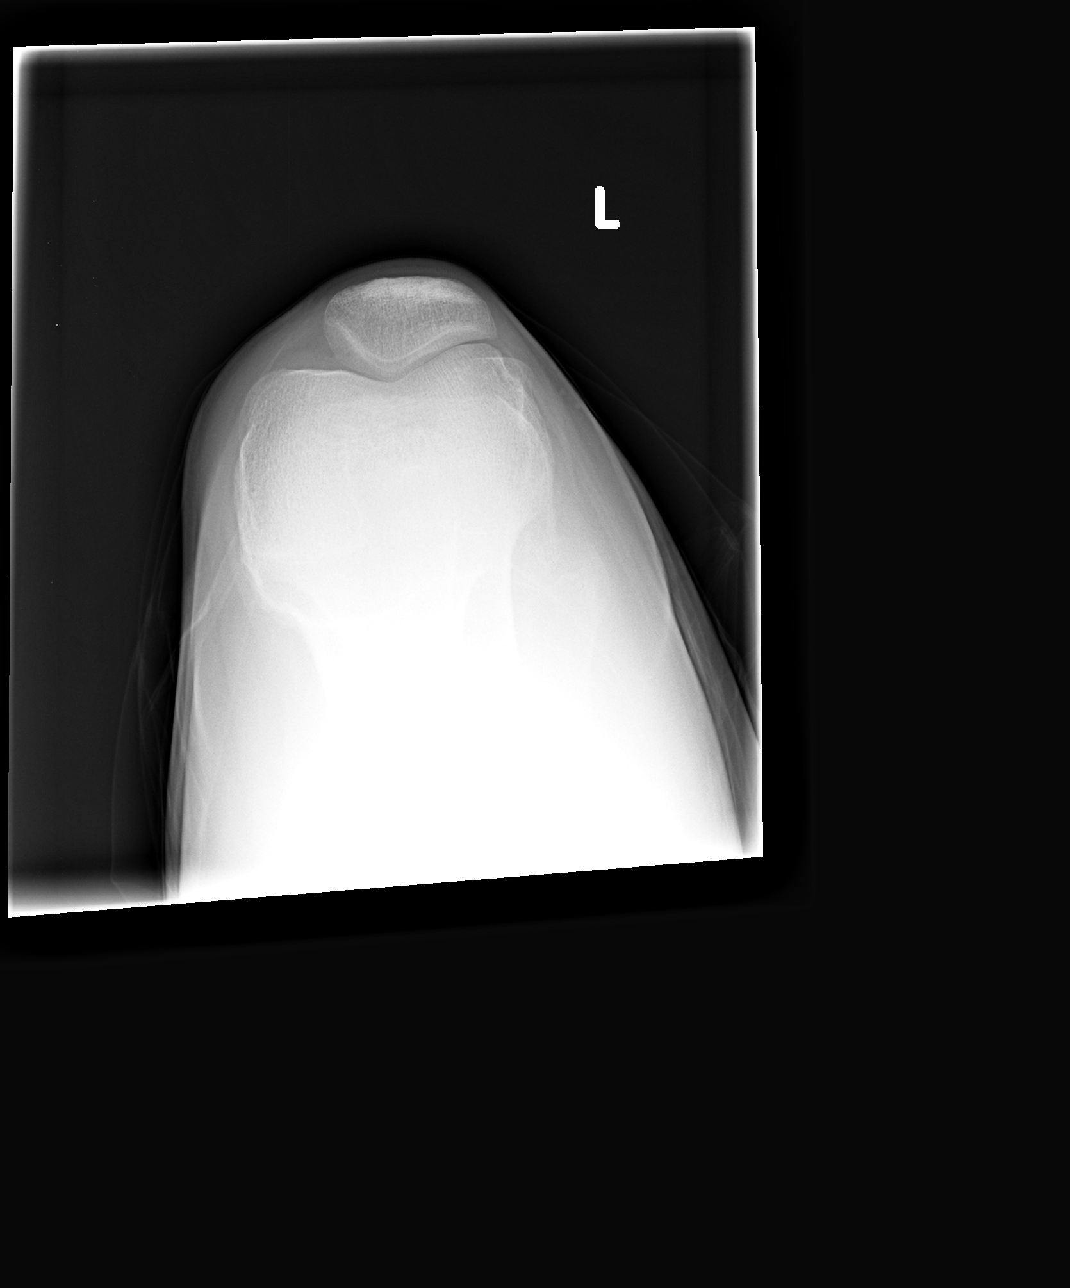

[4 of 4 positions shown; findings below may reference images not displayed]

FINDINGS: There is no fracture or dislocation. No joint space narrowing. The
appearance of the right knee is symmetrical with the left knee.
IMPRESSION: Normal appearing right knee. The appearance is symmetrical with the
left knee.

## 2015-10-04 ENCOUNTER — Encounter: Payer: Self-pay | Admitting: *Deleted

## 2015-10-09 ENCOUNTER — Ambulatory Visit (INDEPENDENT_AMBULATORY_CARE_PROVIDER_SITE_OTHER): Payer: Managed Care, Other (non HMO) | Admitting: *Deleted

## 2015-10-09 DIAGNOSIS — I8393 Asymptomatic varicose veins of bilateral lower extremities: Secondary | ICD-10-CM

## 2015-10-09 NOTE — Progress Notes (Signed)
   Cutaneous Laser:pulsed mode  810j/cm2 400 ms delay  13 ms Duration 0.5 spot  Total pulses: 2956  Total energy 4675  Total time::38  Treated all her tiny red vessels with the CL. Good local rxn. Tol well. Follow prn.

## 2015-10-10 ENCOUNTER — Encounter: Payer: Self-pay | Admitting: Vascular Surgery

## 2015-11-11 ENCOUNTER — Encounter: Payer: Self-pay | Admitting: Vascular Surgery

## 2015-11-13 ENCOUNTER — Ambulatory Visit (INDEPENDENT_AMBULATORY_CARE_PROVIDER_SITE_OTHER): Payer: Managed Care, Other (non HMO) | Admitting: *Deleted

## 2015-11-13 DIAGNOSIS — I8393 Asymptomatic varicose veins of bilateral lower extremities: Secondary | ICD-10-CM

## 2015-11-13 NOTE — Progress Notes (Signed)
Patient ID: Peggy Sanchez, female   DOB: 1985-06-20, 30 y.o.   MRN: [id number]  Treated all areas of concern with the cutaneous laser. She did not get good results from the last tx. Will follow her prn.  Cutaneous Laser:pulsed mode  816j/cm2 400 ms delay  13 ms Duration 0.5 spot  Total pulses: 2221 Total energy 2.995  Total time::10  Photos: No.  Compression stockings applied: Yes.

## 2016-03-24 ENCOUNTER — Ambulatory Visit (INDEPENDENT_AMBULATORY_CARE_PROVIDER_SITE_OTHER): Payer: Managed Care, Other (non HMO) | Admitting: Sports Medicine

## 2016-03-24 DIAGNOSIS — S8262XA Displaced fracture of lateral malleolus of left fibula, initial encounter for closed fracture: Secondary | ICD-10-CM | POA: Insufficient documentation

## 2016-03-24 DIAGNOSIS — S8265XA Nondisplaced fracture of lateral malleolus of left fibula, initial encounter for closed fracture: Secondary | ICD-10-CM

## 2016-03-24 MED ORDER — DICLOFENAC POTASSIUM 25 MG PO CAPS: 1.0000 | ORAL_CAPSULE | Freq: Four times a day (QID) | ORAL | 3 refills | Status: DC

## 2016-03-24 MED ORDER — DICLOFENAC SODIUM 75 MG PO TBEC: 75.0000 mg | DELAYED_RELEASE_TABLET | Freq: Two times a day (BID) | ORAL | 3 refills | Status: AC

## 2016-03-24 NOTE — Assessment & Plan Note (Signed)
Tiny avulsion fracture 2 weeks ago from the lateral malleolus. Continue boot for 2 more weeks, return to see me at that time and we will probably transition her into an ASO brace stirrup Aircast. Diclofenac and a prescription for Zipsor for pain. Minimize weightbearing. Rehabilitation exercises given. Return in 2 weeks.

## 2016-03-24 NOTE — Progress Notes (Signed)
  Subjective:    CC: Left ankle injury  HPI: 2 weeks ago this pleasant 31 year old female inverted her left ankle, she had immediate pain, swelling, bruising, x-ray showed an avulsion fracture from the tip of the lateral malleolus, she was placed in a boot and she is referred to me for further evaluation and definitive treatment. Pain is mild, improving slowly. Still has some bruising localized laterally.  Past medical history:  Negative.  See flowsheet/record as well for more information.  Surgical history: Negative.  See flowsheet/record as well for more information.  Family history: Negative.  See flowsheet/record as well for more information.  Social history: Negative.  See flowsheet/record as well for more information.  Allergies, and medications have been entered into the medical record, reviewed, and no changes needed.   Review of Systems: No fevers, chills, night sweats, weight loss, chest pain, or shortness of breath.   Objective:    General: Well Developed, well nourished, and in no acute distress.  Neuro: Alert and oriented x3, extra-ocular muscles intact, sensation grossly intact.  HEENT: Normocephalic, atraumatic, pupils equal round reactive to light, neck supple, no masses, no lymphadenopathy, thyroid nonpalpable.  Skin: Warm and dry, no rashes. Cardiac: Regular rate and rhythm, no murmurs rubs or gallops, no lower extremity edema.  Respiratory: Clear to auscultation bilaterally. Not using accessory muscles, speaking in full sentences. Left Ankle: Visibly swollen and bruised with mild tenderness over the anterior distal tip of the lateral malleolus Range of motion is full in all directions. Strength is 5/5 in all directions. Stable lateral and medial ligaments; squeeze test and kleiger test unremarkable; Talar dome nontender; No pain at base of 5th MT; No tenderness over cuboid; No tenderness over N spot or navicular prominence No sign of peroneal tendon  subluxations; Negative tarsal tunnel tinel's Able to walk 4 steps.  X-rays from an outside source reviewed and show a small avulsion fracture from the tip of the lateral malleolus. Ankle mortise appears intact.  Disc returned to patient.  Impression and Recommendations:    Fracture of ankle, lateral malleolus, left, closed Tiny avulsion fracture 2 weeks ago from the lateral malleolus. Continue boot for 2 more weeks, return to see me at that time and we will probably transition her into an ASO brace stirrup Aircast. Diclofenac and a prescription for Zipsor for pain. Minimize weightbearing. Rehabilitation exercises given. Return in 2 weeks.

## 2016-04-07 ENCOUNTER — Ambulatory Visit: Payer: Managed Care, Other (non HMO) | Admitting: Sports Medicine

## 2016-04-10 ENCOUNTER — Ambulatory Visit: Payer: Managed Care, Other (non HMO) | Admitting: Sports Medicine

## 2016-05-29 ENCOUNTER — Encounter: Payer: Self-pay | Admitting: *Deleted

## 2016-06-10 ENCOUNTER — Ambulatory Visit (INDEPENDENT_AMBULATORY_CARE_PROVIDER_SITE_OTHER): Payer: Self-pay | Admitting: *Deleted

## 2016-06-10 DIAGNOSIS — I8393 Asymptomatic varicose veins of bilateral lower extremities: Secondary | ICD-10-CM

## 2016-06-10 NOTE — Progress Notes (Signed)
X=.3% Sotradecol administered with a 27g butterfly.  Patient received a total of 5cc.  Cutaneous Laser:pulsed mode  810j/cm2 400 ms delay  13 ms Duration 0.5 spot  Total pulses: 1125 Total energy 1.709  Total time::14    Compression stockings applied: Yes.     Treated with a combo of sclero and CL. She dislikes her red vessels. Easy access. Tol well. Will follow her prn.

## 2016-07-22 ENCOUNTER — Ambulatory Visit: Payer: Managed Care, Other (non HMO) | Admitting: *Deleted

## 2017-06-09 ENCOUNTER — Ambulatory Visit (INDEPENDENT_AMBULATORY_CARE_PROVIDER_SITE_OTHER): Payer: 59 | Admitting: *Deleted

## 2017-06-09 DIAGNOSIS — I8393 Asymptomatic varicose veins of bilateral lower extremities: Secondary | ICD-10-CM

## 2017-06-09 NOTE — Progress Notes (Signed)
X=.3% Sotradecol administered with a 27g butterfly.  Patient received a total of 6cc.  Her spiders were decreased but had some small retics, so treated them. Easy access. Tol really well. Anticipate good results. Follow this sweet lady prn.    Compression stockings applied: Yes.

## 2019-06-06 ENCOUNTER — Ambulatory Visit (INDEPENDENT_AMBULATORY_CARE_PROVIDER_SITE_OTHER): Payer: 59 | Admitting: Psychiatry

## 2019-06-06 ENCOUNTER — Other Ambulatory Visit: Payer: Self-pay

## 2019-06-06 ENCOUNTER — Encounter: Payer: Self-pay | Admitting: Psychiatry

## 2019-06-06 VITALS — BP 108/56 | HR 55 | Ht 65.0 in | Wt 135.0 lb

## 2019-06-06 DIAGNOSIS — F419 Anxiety disorder, unspecified: Secondary | ICD-10-CM | POA: Diagnosis not present

## 2019-06-06 DIAGNOSIS — F329 Major depressive disorder, single episode, unspecified: Secondary | ICD-10-CM

## 2019-06-06 DIAGNOSIS — F32A Depression, unspecified: Secondary | ICD-10-CM

## 2019-06-06 MED ORDER — FLUOXETINE HCL 20 MG PO CAPS: ORAL_CAPSULE | ORAL | 0 refills | Status: DC

## 2019-06-06 MED ORDER — BUPROPION HCL ER (XL) 150 MG PO TB24: 150.0000 mg | ORAL_TABLET | Freq: Every day | ORAL | 1 refills | Status: DC

## 2019-06-06 MED ORDER — SERTRALINE HCL 50 MG PO TABS: ORAL_TABLET | ORAL | 1 refills | Status: DC

## 2019-06-06 NOTE — Progress Notes (Signed)
[facility] MD/PA/NP Initial Note  06/08/2019 9:23 AM Peggy Sanchez  MRN:  [id number]  Chief Complaint:  Chief Complaint    Depression; Anxiety      HPI: Patient is a 34 year old female being seen for initial evaluation for anxiety and depression.  She reports that she has 3 sons, ages 32, 52, and 34 years old.  Her oldest son has ODD, Anxiety, ADHD, and has had significant behavioral issues. She reports that they have had in home treatment and that son went to [facility] for about 9 months. She reports that Abilify has helped some with his behavioral issues. She reports, "everyday is something new. I am so anxious." She reports that she is frequently thinking about what her son might do next and has spent extensive time and money researching and trying possible treatment options for her son. She reports that she was started on Sertraline 25 mg daily and it was later increased to 50 mg po qd. Reports that she had 3 days of significant panic while on Sertraline and was switched to Prozac and started on Klonopin.   She reports that she is often shaking when she wakes up in the morning. She will feel her heart rate and BP increase when her son's behavior escalates. Notices increased anxiety around the time school is about to dismiss. She will feel SOB and like she cannot catch her breath with severe anxiety and will feel the need to leave. Estimates panic occurring about 3-4 times a month. She reports that when she was pregnant she was "almost OCD" with eating only organic and avoiding anything potentially harmful. Denies any other times of OCD s/s. Denies social anxiety.   She reports that her mood has been sad. Has had lower energy and motivation for the last several months and reports that this is unusual for her. She goes to bed around 8-9 pm and awakens around 5 am. Denies difficulty falling or staying asleep. She reports that she has gained 7 lbs since November. No change in appetite. Concentration  has been fair. Denies anhedonia. Denies SI.   She questions if she has seasonal affective d/o and has had lower moods in the winter months and on dreary days. Denies h/o past severe depressive episodes.   She reports that she has been dx'd with ADHD and had significant hyper-activity in childhood.Reports that she took Adderall while in college.   Denies manic s/s. Denies AH, VH, or paranoia.   Born and raised in [place]. Has a younger sister and brother. Sister is a Administrator, Civil Service and they are close. Brother has substance dependence. Reports that her family life in childhood was "nice, normal." Has a bachelor's degree. Worked in pharmaceuticals for 12 years. Took leave through covid and quit job in November after nanny moved. Reports that this is the first time she has not worked. Husband works from home and they have a great relationship. Had infertility issues. Enjoys gardening and having chickens. Brother-in-law and his family no longer have contact with them because of son's behavior. Has made friends with another mom whose son has similar behavioral issues. Has good supports with husband, family, and friends.   Past Psychiatric Medication Trials: Sertraline- Helped initially for several years at 25 mg and then increased to 50 mg daily.  Prozac- Helped initially. Was on 20 mg and increased to 30 mg at the start of the pandemic. Dose was increased to 40 mg and now feels "exhausted" and wants to sleep. No longer seems to be  as helpful. Has been more tired on 40 mg with some improvement.  Klonopin Adderall- took in college.   Visit Diagnosis:    ICD-10-CM   1. Depression, unspecified depression type  F32.9 FLUoxetine (PROZAC) 20 MG capsule    sertraline (ZOLOFT) 50 MG tablet    buPROPion (WELLBUTRIN XL) 150 MG 24 hr tablet  2. Anxiety disorder, unspecified type  F41.9 FLUoxetine (PROZAC) 20 MG capsule    sertraline (ZOLOFT) 50 MG tablet    Past Psychiatric History: Has started seeing a therapist again,  [facility] at [employer]. Denies any other mental health treatment.   Past Medical History:  Past Medical History:  Diagnosis Date  . Dental crowns present   . Difficult intravenous access   . Indication for care in labor or delivery 06/25/2015  . Interstitial cystitis   . Medial meniscus tear 04/2014   left knee  . Postpartum care following vaginal delivery (4/4) 06/25/2015  . Pregnancy with third trimester bleeding, antepartum 06/25/2015    Past Surgical History:  Procedure Laterality Date  . BREAST ENHANCEMENT SURGERY Bilateral   . CHONDROPLASTY Left 04/27/2014   Procedure: CHONDROPLASTY;  Surgeon: Nestor Lewandowsky, MD;  Location: [facility];  Service: Orthopedics;  Laterality: Left;  . CYSTOSCOPY WITH HYDRODISTENSION AND BIOPSY  05/15/2010   bladder instillation therapy  . INGUINAL HERNIA REPAIR Bilateral    as a child  . KNEE ARTHROSCOPY Left 04/27/2014   Procedure: LEFT KNEE ARTHROSCOPY, DEBRIDEMENT OF CHONDROMALACIA  AND EXCISION OF FIBROUS BANDS;  Surgeon: Nestor Lewandowsky, MD;  Location: [facility];  Service: Orthopedics;  Laterality: Left;  . WISDOM TOOTH EXTRACTION      Family Psychiatric History: Mom, dad, and sister take a low dose of Sertraline.  Family History:  Family History  Problem Relation Age of Onset  . Anxiety disorder Mother   . ADD / ADHD Mother   . Anxiety disorder Father   . Anxiety disorder Sister   . ADD / ADHD Brother   . Drug abuse Brother   . ADD / ADHD Son   . ODD Son   . Anxiety disorder Son     Social History:  Social History   Socioeconomic History  . Marital status: Married    Spouse name: Not on file  . Number of children: Not on file  . Years of education: Not on file  . Highest education level: Not on file  Occupational History  . Not on file  Tobacco Use  . Smoking status: Former Smoker    Quit date: 03/23/2007    Years since quitting: 12.2  . Smokeless tobacco: Never Used  Substance and Sexual Activity  .  Alcohol use: Yes    Comment: occasionally on weekends  . Drug use: No  . Sexual activity: Not Currently  Other Topics Concern  . Not on file  Social History Narrative  . Not on file   Social Determinants of Health   Financial Resource Strain:   . Difficulty of Paying Living Expenses:   Food Insecurity:   . Worried About Programme researcher, broadcasting/film/video in the Last Year:   . Barista in the Last Year:   Transportation Needs:   . Freight forwarder (Medical):   Marland Kitchen Lack of Transportation (Non-Medical):   Physical Activity:   . Days of Exercise per Week:   . Minutes of Exercise per Session:   Stress:   . Feeling of Stress :   Social  Connections:   . Frequency of Communication with Friends and Family:   . Frequency of Social Gatherings with Friends and Family:   . Attends Religious Services:   . Active Member of Clubs or Organizations:   . Attends Banker Meetings:   Marland Kitchen Marital Status:     Allergies: No Known Allergies  Metabolic Disorder Labs: No results found for: HGBA1C, MPG No results found for: PROLACTIN No results found for: CHOL, TRIG, HDL, CHOLHDL, VLDL, LDLCALC No results found for: TSH  Therapeutic Level Labs: No results found for: LITHIUM No results found for: VALPROATE No components found for:  CBMZ  Current Medications: Current Outpatient Medications  Medication Sig Dispense Refill  . B Complex Vitamins (VITAMIN-B COMPLEX PO) Take by mouth.    Marland Kitchen [name] 3-0.02 MG tablet Take 1 tablet by mouth daily.    Marland Kitchen oxybutynin (DITROPAN-XL) 5 MG 24 hr tablet Take 5 mg by mouth daily.    . Probiotic Product (PROBIOTIC PO) Take by mouth.    Marland Kitchen buPROPion (WELLBUTRIN XL) 150 MG 24 hr tablet Take 1 tablet (150 mg total) by mouth daily. 30 tablet 1  . FLUoxetine (PROZAC) 20 MG capsule Take 1 capsule for 7-10 days, then stop 10 capsule 0  . FLUoxetine (PROZAC) 40 MG capsule Take 40 mg by mouth daily.    . sertraline (ZOLOFT) 50 MG tablet Take 1/2 tab po qd x 7 days,  then 1 tab po qd 30 tablet 1   No current facility-administered medications for this visit.    Medication Side Effects: none  Orders placed this visit:  No orders of the defined types were placed in this encounter.   Psychiatric Specialty Exam:  Review of Systems  Blood pressure (!) 108/56, pulse (!) 55, height 5\' 5"  (1.651 m), weight 135 lb (61.2 kg), unknown if currently breastfeeding.Body mass index is 22.47 kg/m.  General Appearance: Casual, Neat and Well Groomed  Eye Contact:  Good  Speech:  Clear and Coherent and Normal Rate  Volume:  Normal  Mood:  Anxious and Depressed  Affect:  Anxious  Thought Process:  Coherent, Linear and Descriptions of Associations: Intact  Orientation:  Full (Time, Place, and Person)  Thought Content: Logical and Hallucinations: None   Suicidal Thoughts:  No  Homicidal Thoughts:  No  Memory:  WNL  Judgement:  Good  Insight:  Good  Psychomotor Activity:  Normal  Concentration:  Concentration: Good and Attention Span: Good  Recall:  Good  Fund of Knowledge: Good  Language: Good  Assets:  Communication Skills Desire for Improvement Resilience Social Support  ADL's:  Intact  Cognition: WNL  Prognosis:  Good   Receiving Psychotherapy: Yes   Treatment Plan/Recommendations: Pt seen for 60 minutes and time spent counseling pt re: possible tx options and reviewing different antidepressants and their purposed mechanism of action. Discussed that she was on a low dose of Sertraline in the past and may have responded more to doses above 50 mg po qd. Pt asks about Wellbutrin XL and discussed potential benefits, risks, and side effects. Pt agrees to trial of Wellbutrin XL and re-trial of Sertraline. Will cross-titrate from Prozac to Sertraline. Decrease Prozac to 20 mg po qd for one week, then stop. Start Sertraline 25 mg po qd x 1 week, then increase to 50 mg po qd for anxiety and depression. Will start Wellbutrin XL 150 mg po q am for depression. Pt to  f/u in 5 weeks or sooner if clinically indicated. Patient advised  to contact office with any questions, adverse effects, or acute worsening in signs and symptoms.  Thayer Headings, PMHNP

## 2019-06-19 ENCOUNTER — Telehealth: Payer: Self-pay | Admitting: Psychiatry

## 2019-06-19 DIAGNOSIS — F419 Anxiety disorder, unspecified: Secondary | ICD-10-CM

## 2019-06-19 DIAGNOSIS — F329 Major depressive disorder, single episode, unspecified: Secondary | ICD-10-CM

## 2019-06-19 DIAGNOSIS — F32A Depression, unspecified: Secondary | ICD-10-CM

## 2019-06-19 MED ORDER — SERTRALINE HCL 100 MG PO TABS: 100.0000 mg | ORAL_TABLET | Freq: Every day | ORAL | 0 refills | Status: DC

## 2019-06-19 NOTE — Telephone Encounter (Signed)
She reports that she felt good and had more energy for a few days. "I feel my mood is better" and has done some things for the first time in awhile, such as visiting a friend's new baby. She reports that yesterday there was a sightly upsetting event and she started shaking. She reports that she continues to shake. She reports that she wants to sleep and energy has been lower.   Stopped Prozac on 06/13/19 and is on Sertraline 50 mg po qd.   Denies HA's or discontinuation s/s.   Plan: Increase Sertraline 100 mg po qd. Continue Wellbutrin XL.

## 2019-06-19 NOTE — Telephone Encounter (Signed)
Pt left v-mail stating started new med for 1 1/2 week now. Felt good, more energy, but last 2 days has real bad anxiety. Changed from Prozac to Wellbutrin & Zoloft. Please advise if adjustments need to be made. [phone]

## 2019-06-19 NOTE — Addendum Note (Signed)
Addended by: Derenda Mis on: 06/19/2019 06:20 PM   Modules accepted: Orders

## 2019-06-28 ENCOUNTER — Other Ambulatory Visit: Payer: Self-pay | Admitting: Psychiatry

## 2019-06-28 DIAGNOSIS — F32A Depression, unspecified: Secondary | ICD-10-CM

## 2019-06-28 DIAGNOSIS — F329 Major depressive disorder, single episode, unspecified: Secondary | ICD-10-CM

## 2019-07-11 ENCOUNTER — Ambulatory Visit (INDEPENDENT_AMBULATORY_CARE_PROVIDER_SITE_OTHER): Payer: 59 | Admitting: Psychiatry

## 2019-07-11 ENCOUNTER — Encounter: Payer: Self-pay | Admitting: Psychiatry

## 2019-07-11 ENCOUNTER — Other Ambulatory Visit: Payer: Self-pay

## 2019-07-11 DIAGNOSIS — F419 Anxiety disorder, unspecified: Secondary | ICD-10-CM

## 2019-07-11 DIAGNOSIS — F32A Depression, unspecified: Secondary | ICD-10-CM

## 2019-07-11 DIAGNOSIS — F329 Major depressive disorder, single episode, unspecified: Secondary | ICD-10-CM

## 2019-07-11 MED ORDER — BUPROPION HCL ER (XL) 150 MG PO TB24: 150.0000 mg | ORAL_TABLET | Freq: Every day | ORAL | 0 refills | Status: DC

## 2019-07-11 MED ORDER — SERTRALINE HCL 100 MG PO TABS: 100.0000 mg | ORAL_TABLET | Freq: Every day | ORAL | 0 refills | Status: DC

## 2019-07-11 NOTE — Progress Notes (Signed)
Peggy Sanchez [id number] 11/04/1985 34 y.o.  Subjective:   Patient ID:  Peggy Sanchez is a 34 y.o. (DOB 03/29/1985) female.  Chief Complaint:  Chief Complaint  Patient presents with  . Follow-up    h/o Anxiety and depression    HPI [name] presents to the office today for follow-up of mood and anxiety. She reports that she felt better a few days after increase in Sertraline. She reports that her anxiety has improved. She denies affective dulling. She reports that her worry has been better. She reports that she continues to worry about her son. She reports that her son is on new medication and has seemed to be doing better in terms of his behavior. Improved irritability. Denies any physical s/s of anxiety. Improved energy and motivated and that it has returned to baseline. Has started exercising regularly again. Has been getting excited about gardening again. She reports that she has been sleeping well. Averaging 8-9 hours. Appetite has been the same. Reports that she and her husband have been doing a juice cleanse. Concentration has been adequate. Denies SI.   She notices a "shaky feeling in my chest," typically when she has more coffee. Also experiences this feeling when she is hungry.   Past Psychiatric Medication Trials: Sertraline- Helped initially for several years at 25 mg and then increased to 50 mg daily.  Prozac- Helped initially. Was on 20 mg and increased to 30 mg at the start of the pandemic. Dose was increased to 40 mg and now feels "exhausted" and wants to sleep. No longer seems to be as helpful. Has been more tired on 40 mg with some improvement.  Klonopin Adderall- took in college.    Review of Systems:  Review of Systems  Musculoskeletal: Negative for gait problem.  Neurological: Negative for tremors and headaches.  Psychiatric/Behavioral:       Please refer to HPI    Medications: I have reviewed the patient's current medications.  Current Outpatient  Medications  Medication Sig Dispense Refill  . B Complex Vitamins (VITAMIN-B COMPLEX PO) Take by mouth.    Marland Kitchen buPROPion (WELLBUTRIN XL) 150 MG 24 hr tablet Take 1 tablet (150 mg total) by mouth daily. 90 tablet 0  . [name] 3-0.02 MG tablet Take 1 tablet by mouth daily.    Marland Kitchen oxybutynin (DITROPAN-XL) 5 MG 24 hr tablet Take 5 mg by mouth daily.    . Probiotic Product (PROBIOTIC PO) Take by mouth.    . sertraline (ZOLOFT) 100 MG tablet Take 1 tablet (100 mg total) by mouth daily. 90 tablet 0   No current facility-administered medications for this visit.    Medication Side Effects: Other: Jitteriness  Allergies: No Known Allergies  Past Medical History:  Diagnosis Date  . Dental crowns present   . Difficult intravenous access   . Indication for care in labor or delivery 06/25/2015  . Interstitial cystitis   . Medial meniscus tear 04/2014   left knee  . Postpartum care following vaginal delivery (4/4) 06/25/2015  . Pregnancy with third trimester bleeding, antepartum 06/25/2015    Family History  Problem Relation Age of Onset  . Anxiety disorder Mother   . ADD / ADHD Mother   . Anxiety disorder Father   . Anxiety disorder Sister   . ADD / ADHD Brother   . Drug abuse Brother   . ADD / ADHD Son   . ODD Son   . Anxiety disorder Son     Social History   Socioeconomic  History  . Marital status: Married    Spouse name: Not on file  . Number of children: Not on file  . Years of education: Not on file  . Highest education level: Not on file  Occupational History  . Not on file  Tobacco Use  . Smoking status: Former Smoker    Quit date: 03/23/2007    Years since quitting: 12.3  . Smokeless tobacco: Never Used  Substance and Sexual Activity  . Alcohol use: Yes    Comment: occasionally on weekends  . Drug use: No  . Sexual activity: Not Currently  Other Topics Concern  . Not on file  Social History Narrative  . Not on file   Social Determinants of Health   Financial Resource  Strain:   . Difficulty of Paying Living Expenses:   Food Insecurity:   . Worried About Programme researcher, broadcasting/film/video in the Last Year:   . Barista in the Last Year:   Transportation Needs:   . Freight forwarder (Medical):   Marland Kitchen Lack of Transportation (Non-Medical):   Physical Activity:   . Days of Exercise per Week:   . Minutes of Exercise per Session:   Stress:   . Feeling of Stress :   Social Connections:   . Frequency of Communication with Friends and Family:   . Frequency of Social Gatherings with Friends and Family:   . Attends Religious Services:   . Active Member of Clubs or Organizations:   . Attends Banker Meetings:   Marland Kitchen Marital Status:   Intimate Partner Violence:   . Fear of Current or Ex-Partner:   . Emotionally Abused:   Marland Kitchen Physically Abused:   . Sexually Abused:     Past Medical History, Surgical history, Social history, and Family history were reviewed and updated as appropriate.   Please see review of systems for further details on the patient's review from today.   Objective:   Physical Exam:  BP 111/64   Pulse 71   Wt 135 lb (61.2 kg)   BMI 22.47 kg/m   Physical Exam Constitutional:      General: She is not in acute distress. Musculoskeletal:        General: No deformity.  Neurological:     Mental Status: She is alert and oriented to person, place, and time.     Coordination: Coordination normal.  Psychiatric:        Attention and Perception: Attention and perception normal. She does not perceive auditory or visual hallucinations.        Mood and Affect: Mood normal. Mood is not anxious or depressed. Affect is not labile, blunt, angry or inappropriate.        Speech: Speech normal.        Behavior: Behavior normal.        Thought Content: Thought content normal. Thought content is not paranoid or delusional. Thought content does not include homicidal or suicidal ideation. Thought content does not include homicidal or suicidal plan.         Cognition and Memory: Cognition and memory normal.        Judgment: Judgment normal.     Comments: Insight intact     Lab Review:     Component Value Date/Time   NA 136 06/25/2015 0805   K 3.5 06/25/2015 0805   CL 107 06/25/2015 0805   CO2 25 06/25/2015 0805   GLUCOSE 76 06/25/2015 0805   BUN 8 06/25/2015 0805  CREATININE 0.64 06/25/2015 0805   CALCIUM 8.5 (L) 06/25/2015 0805   PROT 6.3 (L) 06/25/2015 0805   ALBUMIN 2.8 (L) 06/25/2015 0805   AST 18 06/25/2015 0805   ALT 10 (L) 06/25/2015 0805   ALKPHOS 155 (H) 06/25/2015 0805   BILITOT 0.4 06/25/2015 0805   GFRNONAA >60 06/25/2015 0805   GFRAA >60 06/25/2015 0805       Component Value Date/Time   WBC 11.6 (H) 06/26/2015 0502   RBC 3.59 (L) 06/26/2015 0502   HGB 11.1 (L) 06/26/2015 0502   HCT 32.0 (L) 06/26/2015 0502   PLT 223 06/26/2015 0502   MCV 89.1 06/26/2015 0502   MCH 30.9 06/26/2015 0502   MCHC 34.7 06/26/2015 0502   RDW 13.1 06/26/2015 0502    No results found for: POCLITH, LITHIUM   No results found for: PHENYTOIN, PHENOBARB, VALPROATE, CBMZ   .res Assessment: Plan:   She would prefer to continue current medications without changes since she has noticed a significant improvement in mood and anxiety s/s without tolerability issues.  Continue Wellbutrin XL 150 mg po qd for depression. Continue Sertraline 100 mg po qd for depression and anxiety. Pt to f/u in 3 months or sooner if clinically indicated.  Patient advised to contact office with any questions, adverse effects, or acute worsening in signs and symptoms.  Amey was seen today for follow-up.  Diagnoses and all orders for this visit:  Depression, unspecified depression type -     buPROPion (WELLBUTRIN XL) 150 MG 24 hr tablet; Take 1 tablet (150 mg total) by mouth daily. -     sertraline (ZOLOFT) 100 MG tablet; Take 1 tablet (100 mg total) by mouth daily.  Anxiety disorder, unspecified type -     sertraline (ZOLOFT) 100 MG tablet; Take 1  tablet (100 mg total) by mouth daily.     Please see After Visit Summary for patient specific instructions.  Future Appointments  Date Time Provider Department Center  10/10/2019  9:00 AM Corie Chiquito, PMHNP [facility] None    No orders of the defined types were placed in this encounter.   -------------------------------

## 2019-09-12 ENCOUNTER — Ambulatory Visit: Payer: 59 | Admitting: Sports Medicine

## 2019-09-19 ENCOUNTER — Encounter: Payer: Self-pay | Admitting: Sports Medicine

## 2019-09-19 ENCOUNTER — Ambulatory Visit: Payer: 59 | Admitting: Sports Medicine

## 2019-09-19 ENCOUNTER — Ambulatory Visit (INDEPENDENT_AMBULATORY_CARE_PROVIDER_SITE_OTHER): Payer: 59 | Admitting: Sports Medicine

## 2019-09-19 ENCOUNTER — Other Ambulatory Visit: Payer: Self-pay

## 2019-09-19 DIAGNOSIS — M5030 Other cervical disc degeneration, unspecified cervical region: Secondary | ICD-10-CM

## 2019-09-19 MED ORDER — PREDNISONE 50 MG PO TABS: ORAL_TABLET | ORAL | 0 refills | Status: DC

## 2019-09-19 MED ORDER — ALL-BODY MASSAGE MISC: 0 refills | Status: AC
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
## 2019-10-03 ENCOUNTER — Other Ambulatory Visit: Payer: Self-pay | Admitting: Psychiatry

## 2019-10-03 DIAGNOSIS — F32A Depression, unspecified: Secondary | ICD-10-CM

## 2019-10-03 DIAGNOSIS — F419 Anxiety disorder, unspecified: Secondary | ICD-10-CM

## 2019-10-10 ENCOUNTER — Ambulatory Visit (INDEPENDENT_AMBULATORY_CARE_PROVIDER_SITE_OTHER): Payer: 59 | Admitting: Psychiatry

## 2019-10-10 ENCOUNTER — Other Ambulatory Visit: Payer: Self-pay

## 2019-10-10 ENCOUNTER — Encounter: Payer: Self-pay | Admitting: Psychiatry

## 2019-10-10 DIAGNOSIS — F329 Major depressive disorder, single episode, unspecified: Secondary | ICD-10-CM | POA: Diagnosis not present

## 2019-10-10 DIAGNOSIS — F32A Depression, unspecified: Secondary | ICD-10-CM

## 2019-10-10 DIAGNOSIS — F419 Anxiety disorder, unspecified: Secondary | ICD-10-CM

## 2019-10-10 MED ORDER — DEPLIN 15 15-90.314 MG PO CAPS: 15.0000 mg | ORAL_CAPSULE | Freq: Every day | ORAL | 0 refills | Status: DC

## 2019-10-10 MED ORDER — SERTRALINE HCL 100 MG PO TABS: 100.0000 mg | ORAL_TABLET | Freq: Every day | ORAL | 0 refills | Status: DC

## 2019-10-10 MED ORDER — BUPROPION HCL ER (XL) 150 MG PO TB24: 150.0000 mg | ORAL_TABLET | Freq: Every day | ORAL | 0 refills | Status: DC
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
## 2020-01-10 ENCOUNTER — Ambulatory Visit: Payer: 59 | Admitting: Psychiatry

## 2020-01-25 ENCOUNTER — Ambulatory Visit (INDEPENDENT_AMBULATORY_CARE_PROVIDER_SITE_OTHER): Payer: 59 | Admitting: Psychiatry

## 2020-01-25 ENCOUNTER — Encounter: Payer: Self-pay | Admitting: Psychiatry

## 2020-01-25 ENCOUNTER — Other Ambulatory Visit: Payer: Self-pay

## 2020-01-25 DIAGNOSIS — F419 Anxiety disorder, unspecified: Secondary | ICD-10-CM | POA: Diagnosis not present

## 2020-01-25 DIAGNOSIS — F32A Depression, unspecified: Secondary | ICD-10-CM | POA: Diagnosis not present

## 2020-01-25 MED ORDER — CLONAZEPAM 0.5 MG PO TABS: ORAL_TABLET | ORAL | 0 refills | Status: DC

## 2020-01-25 MED ORDER — SERTRALINE HCL 100 MG PO TABS: 100.0000 mg | ORAL_TABLET | Freq: Every day | ORAL | 1 refills | Status: DC

## 2020-01-25 MED ORDER — BUPROPION HCL ER (XL) 150 MG PO TB24: 150.0000 mg | ORAL_TABLET | Freq: Every day | ORAL | 1 refills | Status: DC
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
## 2020-03-12 ENCOUNTER — Other Ambulatory Visit: Payer: Self-pay | Admitting: Obstetrics and Gynecology

## 2020-03-12 DIAGNOSIS — R591 Generalized enlarged lymph nodes: Secondary | ICD-10-CM

## 2020-04-24 DIAGNOSIS — R059 Cough, unspecified: Secondary | ICD-10-CM | POA: Diagnosis not present

## 2020-04-24 DIAGNOSIS — Z20822 Contact with and (suspected) exposure to covid-19: Secondary | ICD-10-CM | POA: Diagnosis not present

## 2020-05-24 ENCOUNTER — Ambulatory Visit (INDEPENDENT_AMBULATORY_CARE_PROVIDER_SITE_OTHER): Payer: BC Managed Care – PPO

## 2020-05-24 ENCOUNTER — Ambulatory Visit (INDEPENDENT_AMBULATORY_CARE_PROVIDER_SITE_OTHER): Payer: Self-pay

## 2020-05-24 ENCOUNTER — Ambulatory Visit (INDEPENDENT_AMBULATORY_CARE_PROVIDER_SITE_OTHER): Payer: BC Managed Care – PPO | Admitting: Sports Medicine

## 2020-05-24 ENCOUNTER — Other Ambulatory Visit: Payer: Self-pay

## 2020-05-24 DIAGNOSIS — M25462 Effusion, left knee: Secondary | ICD-10-CM

## 2020-05-24 DIAGNOSIS — M25562 Pain in left knee: Secondary | ICD-10-CM

## 2020-05-24 DIAGNOSIS — M23204 Derangement of unspecified medial meniscus due to old tear or injury, left knee: Secondary | ICD-10-CM | POA: Diagnosis not present

## 2020-05-24 MED ORDER — ELYXYB 120 MG/4.8ML PO SOLN: 4.8000 mL | Freq: Two times a day (BID) | ORAL | 11 refills | Status: DC | PRN

## 2020-05-24 NOTE — Progress Notes (Signed)
    Procedures performed today:    Procedure: Real-time Ultrasound Guided  aspiration/injection of left knee Device: Samsung HS60  Verbal informed consent obtained.  Time-out conducted.  Noted no overlying erythema, induration, or other signs of local infection.  Skin prepped in a sterile fashion.  Local anesthesia: Topical Ethyl chloride.  With sterile technique and under real time ultrasound guidance:  Noted moderate effusion, aspirated approximately 15 mL of clear yellow fluid with an 18-gauge needle, syringe switched and 1 cc Kenalog 40, 2 cc lidocaine, 2 cc bupivacaine injected easily Completed without difficulty  Advised to call if fevers/chills, erythema, induration, drainage, or persistent bleeding.  Images permanently stored and available for review in PACS.  Impression: Technically successful ultrasound guided injection.  Independent interpretation of notes and tests performed by another provider:   None.  Brief History, Exam, Impression, and Recommendations:    Pain and swelling of left knee This is a pleasant 35 year old female, Tuesday she was moving some large pieces of wood, planted and twisted and had immediate pain, swelling in her left knee, effusion, tenderness at the tip of the medial femoral condyle. Today we did an arthrocentesis with injection, adding x-rays, MRI. Continue current brace, minimal weightbearing. Starting prescription strength celecoxib. Follow-up will depend on MRI results.    ___________________________________________ Ihor Austin. Benjamin Stain, M.D., ABFM., CAQSM. Primary Care and Sports Medicine [facility]  Adjunct Instructor of Family Medicine  [employer]

## 2020-05-24 NOTE — Assessment & Plan Note (Signed)
This is a pleasant 34 year old female, Tuesday she was moving some large pieces of wood, planted and twisted and had immediate pain, swelling in her left knee, effusion, tenderness at the tip of the medial femoral condyle. Today we did an arthrocentesis with injection, adding x-rays, MRI. Continue current brace, minimal weightbearing. Starting prescription strength celecoxib. Follow-up will depend on MRI results.

## 2020-05-25 ENCOUNTER — Ambulatory Visit (INDEPENDENT_AMBULATORY_CARE_PROVIDER_SITE_OTHER): Payer: BC Managed Care – PPO

## 2020-05-25 DIAGNOSIS — M25462 Effusion, left knee: Secondary | ICD-10-CM | POA: Diagnosis not present

## 2020-05-25 DIAGNOSIS — M25562 Pain in left knee: Secondary | ICD-10-CM | POA: Diagnosis not present

## 2020-05-25 DIAGNOSIS — S8992XA Unspecified injury of left lower leg, initial encounter: Secondary | ICD-10-CM | POA: Diagnosis not present

## 2020-05-25 DIAGNOSIS — M23204 Derangement of unspecified medial meniscus due to old tear or injury, left knee: Secondary | ICD-10-CM

## 2020-05-25 DIAGNOSIS — M7122 Synovial cyst of popliteal space [Baker], left knee: Secondary | ICD-10-CM | POA: Diagnosis not present

## 2020-05-28 ENCOUNTER — Ambulatory Visit: Payer: 59 | Admitting: Sports Medicine

## 2020-07-03 ENCOUNTER — Telehealth: Payer: Self-pay | Admitting: Sports Medicine

## 2020-07-03 ENCOUNTER — Other Ambulatory Visit: Payer: Self-pay

## 2020-07-03 ENCOUNTER — Ambulatory Visit (INDEPENDENT_AMBULATORY_CARE_PROVIDER_SITE_OTHER): Payer: 59 | Admitting: Sports Medicine

## 2020-07-03 ENCOUNTER — Ambulatory Visit (INDEPENDENT_AMBULATORY_CARE_PROVIDER_SITE_OTHER): Payer: Self-pay

## 2020-07-03 DIAGNOSIS — M25462 Effusion, left knee: Secondary | ICD-10-CM

## 2020-07-03 DIAGNOSIS — S72492D Other fracture of lower end of left femur, subsequent encounter for closed fracture with routine healing: Secondary | ICD-10-CM

## 2020-07-03 DIAGNOSIS — M25562 Pain in left knee: Secondary | ICD-10-CM

## 2020-07-03 DIAGNOSIS — T24212A Burn of second degree of left thigh, initial encounter: Secondary | ICD-10-CM | POA: Insufficient documentation

## 2020-07-03 MED ORDER — TRAMADOL HCL 50 MG PO TABS: 50.0000 mg | ORAL_TABLET | Freq: Three times a day (TID) | ORAL | 0 refills | Status: DC | PRN

## 2020-07-03 MED ORDER — SILVER SULFADIAZINE 1 % EX CREA: 1.0000 "application " | TOPICAL_CREAM | Freq: Every day | CUTANEOUS | 0 refills | Status: DC

## 2020-07-03 NOTE — Assessment & Plan Note (Addendum)
Peggy Sanchez returns, she was moving some large pieces of wood approximately a month ago, planted and twisted and had immediate pain and swelling. We did an arthrocentesis with injection at the last visit, added an MRI that did show a fairly large osteochondral defect along the weightbearing surface of the medial femoral condyle per She continues to have pain and swelling so we will get her referred to Dr. Everardo Pacific, I do suspect she will need a microfracture or cartilage transfer/graft, adding tramadol for pain, reaction knee brace, I did aspirate the knee again today. I am also going to get her approved for viscosupplementation for use after surgery.

## 2020-07-03 NOTE — Telephone Encounter (Signed)
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
## 2020-07-04 NOTE — Telephone Encounter (Signed)
Started process for Orthovisc waiting on BID and to See if PA is required - CF

## 2020-07-24 ENCOUNTER — Ambulatory Visit: Payer: 59 | Admitting: Psychiatry

## 2020-07-31 ENCOUNTER — Ambulatory Visit: Payer: Self-pay | Admitting: Sports Medicine

## 2020-08-22 NOTE — Telephone Encounter (Signed)
Received BId and patient is covered at 100% and A PA is required working on Prior auth - CF

## 2020-08-23 NOTE — Telephone Encounter (Signed)
Received Authorization from Peggy Sanchez patient is covered for Orthovisc at 100%  Auth: [id number] Valid: 08/22/2020 - 08/22/2021  I have called patient and left her a voicemail to get her scheduled. - CF

## 2020-08-30 ENCOUNTER — Ambulatory Visit: Payer: Self-pay

## 2020-08-30 ENCOUNTER — Ambulatory Visit (INDEPENDENT_AMBULATORY_CARE_PROVIDER_SITE_OTHER): Payer: 59 | Admitting: Sports Medicine

## 2020-08-30 ENCOUNTER — Other Ambulatory Visit: Payer: Self-pay

## 2020-08-30 DIAGNOSIS — S72492D Other fracture of lower end of left femur, subsequent encounter for closed fracture with routine healing: Secondary | ICD-10-CM | POA: Diagnosis not present

## 2020-08-30 MED ORDER — MELOXICAM 15 MG PO TABS: ORAL_TABLET | ORAL | 3 refills | Status: DC

## 2020-08-30 NOTE — Progress Notes (Signed)
    Procedures performed today:    Procedure: Real-time Ultrasound Guided aspiration/injection of left knee Baker's cyst Device: Samsung HS60  Verbal informed consent obtained.  Time-out conducted.  Noted no overlying erythema, induration, or other signs of local infection.  Skin prepped in a sterile fashion.  Local anesthesia: Topical Ethyl chloride.  With sterile technique and under real time ultrasound guidance: Noted large Baker's cyst, I advanced an 18-gauge needle into the Baker's cyst and aspirated 12 mL of serosanguineous thick fluid, syringe switched and 1 cc lidocaine, 1 cc kenalog 40 injected easily. Completed without difficulty  Advised to call if fevers/chills, erythema, induration, drainage, or persistent bleeding.  Images permanently stored and available for review in PACS.  Impression: Technically successful ultrasound guided injection.  Procedure: Real-time Ultrasound Guided injection of the left knee Device: Samsung HS60  Verbal informed consent obtained.  Time-out conducted.  Noted no overlying erythema, induration, or other signs of local infection.  Skin prepped in a sterile fashion.  Local anesthesia: Topical Ethyl chloride.  With sterile technique and under real time ultrasound guidance: Noted effusion, 30 mg/2 mL of OrthoVisc (sodium hyaluronate) in a prefilled syringe was injected easily into the knee through a 22-gauge needle. Completed without difficulty  Advised to call if fevers/chills, erythema, induration, drainage, or persistent bleeding.  Images permanently stored and available for review in PACS.  Impression: Technically successful ultrasound guided injection.  Independent interpretation of notes and tests performed by another provider:   None.  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 She has been to [facility] for discussion of cartilage repair. We are going to try a course of Orthovisc first, I drained her Baker's cyst today, we also did Orthovisc No. 1 of 4 into the left knee, return in 1 week for Orthovisc No. 2 of 4. We will switch to meloxicam for daily pain as well. If this does not work after the full 4 shot series of Orthovisc she will need to see Dr. Everardo Pacific for consideration of cartilage repair.    ___________________________________________ Ihor Austin. Benjamin Stain, M.D., ABFM., CAQSM. Primary Care and Sports Medicine [facility]  Adjunct Instructor of Family Medicine  [employer]

## 2020-08-30 NOTE — Assessment & Plan Note (Addendum)
Peggy Sanchez returns, she is a very pleasant 35 year old female, after injury a few months ago she was found to have a osteochondral defect along the weightbearing surface of the medial femoral condyle. She has been to [facility] for discussion of cartilage repair. We are going to try a course of Orthovisc first, I drained her Baker's cyst today, we also did Orthovisc No. 1 of 4 into the left knee, return in 1 week for Orthovisc No. 2 of 4. We will switch to meloxicam for daily pain as well. If this does not work after the full 4 shot series of Orthovisc she will need to see Dr. Everardo Pacific for consideration of cartilage repair.

## 2020-09-06 ENCOUNTER — Other Ambulatory Visit: Payer: Self-pay

## 2020-09-06 ENCOUNTER — Ambulatory Visit (INDEPENDENT_AMBULATORY_CARE_PROVIDER_SITE_OTHER): Payer: 59 | Admitting: Sports Medicine

## 2020-09-06 ENCOUNTER — Ambulatory Visit: Payer: Self-pay

## 2020-09-06 DIAGNOSIS — S72492D Other fracture of lower end of left femur, subsequent encounter for closed fracture with routine healing: Secondary | ICD-10-CM

## 2020-09-06 NOTE — Progress Notes (Signed)
    Procedures performed today:    Procedure: Real-time Ultrasound Guided injection of the left knee Device: Samsung HS60 Verbal informed consent obtained. Time-out conducted. Noted no overlying erythema, induration, or other signs of local infection. Skin prepped in a sterile fashion. Local anesthesia: Topical Ethyl chloride. With sterile technique and under real time ultrasound guidance: Noted effusion, 30 mg/2 mL of OrthoVisc (sodium hyaluronate) in a prefilled syringe was injected easily into the knee through a 22-gauge needle. Completed without difficulty Advised to call if fevers/chills, erythema, induration, drainage, or persistent bleeding. Images permanently stored and available for review in PACS. Impression: Technically successful ultrasound guided injection.  Independent interpretation of notes and tests performed by another provider:   None.  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 Orthovisc No. 2 of 4 today, return in 1 week for #3 of 4. If insufficient improvement after a full course of Orthovisc she will see Dr. Everardo Pacific for consideration of cartilage repair techniques.    ___________________________________________ Peggy Sanchez. Benjamin Stain, M.D., ABFM., CAQSM. Primary Care and Sports Medicine [facility]  Adjunct Instructor of Family Medicine  [employer]

## 2020-09-06 NOTE — Assessment & Plan Note (Signed)
Peggy Sanchez returns, she is a very pleasant 35 year old female, she had an injury several months ago and was found to have an osteochondral defect along the weightbearing surface of the medial femoral condyle, there was discussion of cartilage repair but we had plans to try Orthovisc first, she does have a Baker's cyst, drained at the last visit. Orthovisc No. 2 of 4 today, return in 1 week for #3 of 4. If insufficient improvement after a full course of Orthovisc she will see Dr. Everardo Pacific for consideration of cartilage repair techniques.

## 2020-09-13 ENCOUNTER — Ambulatory Visit: Payer: Self-pay

## 2020-09-13 ENCOUNTER — Other Ambulatory Visit: Payer: Self-pay

## 2020-09-13 ENCOUNTER — Ambulatory Visit (INDEPENDENT_AMBULATORY_CARE_PROVIDER_SITE_OTHER): Payer: 59 | Admitting: Sports Medicine

## 2020-09-13 DIAGNOSIS — W57XXXA Bitten or stung by nonvenomous insect and other nonvenomous arthropods, initial encounter: Secondary | ICD-10-CM | POA: Insufficient documentation

## 2020-09-13 DIAGNOSIS — S80869A Insect bite (nonvenomous), unspecified lower leg, initial encounter: Secondary | ICD-10-CM

## 2020-09-13 DIAGNOSIS — M25562 Pain in left knee: Secondary | ICD-10-CM | POA: Diagnosis not present

## 2020-09-13 DIAGNOSIS — S72492D Other fracture of lower end of left femur, subsequent encounter for closed fracture with routine healing: Secondary | ICD-10-CM

## 2020-09-13 DIAGNOSIS — M7122 Synovial cyst of popliteal space [Baker], left knee: Secondary | ICD-10-CM

## 2020-09-13 MED ORDER — TRIAMCINOLONE ACETONIDE 0.5 % EX CREA: 1.0000 "application " | TOPICAL_CREAM | Freq: Two times a day (BID) | CUTANEOUS | 3 refills | Status: AC

## 2020-09-13 NOTE — Progress Notes (Addendum)
    Procedures performed today:    Procedure: Real-time Ultrasound Guided injection of the left knee Device: Samsung HS60 Verbal informed consent obtained. Time-out conducted. Noted no overlying erythema, induration, or other signs of local infection. Skin prepped in a sterile fashion. Local anesthesia: Topical Ethyl chloride. With sterile technique and under real time ultrasound guidance: Noted effusion, 30 mg/2 mL of OrthoVisc (sodium hyaluronate) in a prefilled syringe was injected easily into the knee through a 22-gauge needle. Completed without difficulty Advised to call if fevers/chills, erythema, induration, drainage, or persistent bleeding. Images permanently stored and available for review in PACS. Impression: Technically successful ultrasound guided injection.  Procedure: Real-time Ultrasound Guided aspiration of left knee Baker's cyst Device: Samsung HS60  Verbal informed consent obtained.  Time-out conducted.  Noted no overlying erythema, induration, or other signs of local infection.  Skin prepped in a sterile fashion.  Local anesthesia: Topical Ethyl chloride.  With sterile technique and under real time ultrasound guidance: I advanced an 18-gauge needle into a complex Baker's cyst and aspirated approximately 5 mL of orange thick clear fluid. Completed without difficulty  Advised to call if fevers/chills, erythema, induration, drainage, or persistent bleeding.  Images permanently stored and available for review in PACS.  Impression: Technically successful ultrasound guided injection.  Independent interpretation of notes and tests performed by another provider:   None.  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 There was  a discussion of cartilage repair but the plan was for Orthovisc first. Today we did Orthovisc No. 3 of 4, if insufficient improvement after a full course of Orthovisc she will see Dr. Everardo Pacific for consideration of cartilage repair techniques. Of note she is doing a lot better, she is able to work out and do squats.  Insect bite Adding topical triamcinolone.    ___________________________________________ Ihor Austin. Benjamin Stain, M.D., ABFM., CAQSM. Primary Care and Sports Medicine [facility]  Adjunct Instructor of Family Medicine  [employer]

## 2020-09-13 NOTE — Assessment & Plan Note (Addendum)
Peggy Sanchez, she is a pleasant 35 year old female with an injury several months ago, osteochondral defect along the weightbearing surface of the medial femoral condyle. There was a discussion of cartilage repair but the plan was for Orthovisc first. Today we did Orthovisc No. 3 of 4, if insufficient improvement after a full course of Orthovisc she will see Dr. Everardo Pacific for consideration of cartilage repair techniques. Of note she is doing a lot better, she is able to work out and do squats.

## 2020-09-13 NOTE — Addendum Note (Signed)
Addended by: Monica Becton on: 09/13/2020 10:43 AM   Modules accepted: Orders

## 2020-09-13 NOTE — Assessment & Plan Note (Signed)
Adding topical triamcinolone °

## 2020-09-20 ENCOUNTER — Ambulatory Visit (INDEPENDENT_AMBULATORY_CARE_PROVIDER_SITE_OTHER): Payer: 59 | Admitting: Sports Medicine

## 2020-09-20 ENCOUNTER — Ambulatory Visit: Payer: Self-pay

## 2020-09-20 ENCOUNTER — Other Ambulatory Visit: Payer: Self-pay

## 2020-09-20 DIAGNOSIS — S72492D Other fracture of lower end of left femur, subsequent encounter for closed fracture with routine healing: Secondary | ICD-10-CM

## 2020-09-20 NOTE — Assessment & Plan Note (Addendum)
Orthovisc injection #4 of 4 left knee. To recap she is a pleasant 35 year old female with an injury several months ago that ended up with an osteochondral defect along the weightbearing surface of the medial femoral condyle. If Orthovisc does not help significantly then she will need to see Dr. Everardo Pacific for cartilage repair techniques. Overall doing well, she can return to see me as needed. Baker's cyst is present but not bothering her so we will leave this alone.

## 2020-09-20 NOTE — Progress Notes (Signed)
    Procedures performed today:    Procedure: Real-time Ultrasound Guided injection of the left knee Device: Samsung HS60 Verbal informed consent obtained. Time-out conducted. Noted no overlying erythema, induration, or other signs of local infection. Skin prepped in a sterile fashion. Local anesthesia: Topical Ethyl chloride. With sterile technique and under real time ultrasound guidance: Noted effusion, 30 mg/2 mL of OrthoVisc (sodium hyaluronate) in a prefilled syringe was injected easily into the knee through a 22-gauge needle. Completed without difficulty Advised to call if fevers/chills, erythema, induration, drainage, or persistent bleeding. Images permanently stored and available for review in PACS. Impression: Technically successful ultrasound guided injection.  Independent interpretation of notes and tests performed by another provider:   None.  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 To recap she is a pleasant 35 year old female with an injury several months ago that ended up with an osteochondral defect along the weightbearing surface of the medial femoral condyle. If Orthovisc does not help significantly then she will need to see Dr. Everardo Pacific for cartilage repair techniques. Overall doing well, she can return to see me as needed. Baker's cyst is present but not bothering her so we will leave this alone.    ___________________________________________ Ihor Austin. Benjamin Stain, M.D., ABFM., CAQSM. Primary Care and Sports Medicine [facility]  Adjunct Instructor of Family Medicine  [employer]

## 2020-10-11 ENCOUNTER — Other Ambulatory Visit: Payer: Self-pay | Admitting: Psychiatry

## 2020-10-11 DIAGNOSIS — F32A Depression, unspecified: Secondary | ICD-10-CM

## 2020-10-14 NOTE — Telephone Encounter (Signed)
Please schedule appt

## 2020-10-18 ENCOUNTER — Other Ambulatory Visit: Payer: Self-pay

## 2020-10-18 DIAGNOSIS — F32A Depression, unspecified: Secondary | ICD-10-CM

## 2020-10-18 DIAGNOSIS — F419 Anxiety disorder, unspecified: Secondary | ICD-10-CM

## 2020-10-18 MED ORDER — SERTRALINE HCL 100 MG PO TABS: 100.0000 mg | ORAL_TABLET | Freq: Every day | ORAL | 1 refills | Status: DC

## 2020-10-18 NOTE — Telephone Encounter (Signed)
Pt has an appt for 9/8. She needs welbutrin and zoloft refills

## 2020-10-30 ENCOUNTER — Telehealth: Payer: Self-pay | Admitting: Sports Medicine

## 2020-10-30 DIAGNOSIS — F4329 Adjustment disorder with other symptoms: Secondary | ICD-10-CM

## 2020-10-30 DIAGNOSIS — F4320 Adjustment disorder, unspecified: Secondary | ICD-10-CM | POA: Insufficient documentation
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
## 2020-11-22 ENCOUNTER — Other Ambulatory Visit: Payer: Self-pay

## 2020-11-22 ENCOUNTER — Ambulatory Visit (INDEPENDENT_AMBULATORY_CARE_PROVIDER_SITE_OTHER): Payer: 59 | Admitting: Psychiatry

## 2020-11-22 ENCOUNTER — Encounter: Payer: Self-pay | Admitting: Psychiatry

## 2020-11-22 DIAGNOSIS — F32A Depression, unspecified: Secondary | ICD-10-CM | POA: Diagnosis not present

## 2020-11-22 DIAGNOSIS — F419 Anxiety disorder, unspecified: Secondary | ICD-10-CM

## 2020-11-22 MED ORDER — SERTRALINE HCL 50 MG PO TABS: 75.0000 mg | ORAL_TABLET | Freq: Every day | ORAL | 0 refills | Status: DC

## 2020-11-22 NOTE — Progress Notes (Signed)
Peggy Sanchez [id number] 08-28-1985 35 y.o.  Subjective:   Patient ID:  Peggy Sanchez is a 35 y.o. (DOB [birth year]) female.  Chief Complaint:  Chief Complaint  Patient presents with   Follow-up    Anxiety   Medication Problem    Affective dulling     HPI Scotlyn Mccranie presents to the office today for follow-up of anxiety. [place] and reports that son's behavior "got way worse." They were having intensive outpatient treatment and providers recommended psychiatric residential treatment facility. 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 She has been living in an apartment and will be moving to a house. She reports that she has been happier since marital separation. Separated in April. Son will be going to the [employer] school in [place] on 12/09/20 and he will come home on weekends. Has been dating someone new.   Notices some slight affective dulling and reports feeling about "80%" of emotional range. Denies any panic attacks. Anxiety has been manageable. Denies depressed mood. Sleeping well. Appetite has been good. Energy and motivation have been low. She reports some increased difficulty with concentration with needing to balance multiple tasks. Denies anhedonia. "I'm excited about life again." Denies SI.   Working in Manufacturing systems engineer again. Parents are supportive. She and her sister have always been close. Sister is now estranged from her and parents.   Past Psychiatric Medication Trials: Sertraline- Helped initially for several years at 25 mg and then increased to 50 mg daily.  Prozac- Helped initially. Was on 20 mg and increased to 30 mg at the start of the pandemic. Dose was increased to 40 mg and now feels "exhausted" and wants to sleep. No longer seems to be as helpful. Has been more tired on 40 mg with some improvement.  Klonopin Adderall- took in college.    Review of  Systems:  Review of Systems  Gastrointestinal: Negative.   Musculoskeletal:  Negative for gait problem.  Neurological:  Negative for tremors.  Psychiatric/Behavioral:         Please refer to HPI   Medications: I have reviewed the patient's current medications.  Current Outpatient Medications  Medication Sig Dispense Refill   buPROPion (WELLBUTRIN XL) 150 MG 24 hr tablet TAKE 1 TABLET BY MOUTH EVERY DAY 90 tablet 1   [name] 3-0.02 MG tablet Take 1 tablet by mouth daily.     clonazePAM (KLONOPIN) 0.5 MG tablet Take 1/2-1 tab po qd prn panic attacks 10 tablet 0   meloxicam (MOBIC) 15 MG tablet One tab PO qAM with a meal for 2 weeks, then daily prn pain. (Patient not taking: Reported on 11/22/2020) 30 tablet 3   Misc. Devices (ALL-BODY MASSAGE) MISC Full body and paracervical massage 3 times weekly, medically necessary. 1 each 0   oxybutynin (DITROPAN-XL) 5 MG 24 hr tablet Take 5 mg by mouth daily. (Patient not taking: No sig reported)     predniSONE (DELTASONE) 50 MG tablet One tab PO daily for 5 days. (Patient not taking: No sig reported) 5 tablet 0   sertraline (ZOLOFT) 50 MG tablet Take 1.5 tablets (75 mg total) by mouth daily. 135 tablet 0   triamcinolone cream (KENALOG) 0.5 % Apply 1 application topically 2 (two) times daily. To affected areas. (Patient not taking: Reported on 11/22/2020) 30 g 3   No current facility-administered medications for this visit.    Medication Side Effects:  None  Allergies: No Known Allergies  Past Medical History:  Diagnosis Date   Dental crowns present    Difficult intravenous access    Indication for care in labor or delivery 06/25/2015   Interstitial cystitis    Medial meniscus tear 04/2014   left knee   Postpartum care following vaginal delivery (4/4) 06/25/2015   Pregnancy with third trimester bleeding, antepartum 06/25/2015    Past Medical History, Surgical history, Social history, and Family history were reviewed and updated as appropriate.    Please see review of systems for further details on the patient's review from today.   Objective:   Physical Exam:  There were no vitals taken for this visit.  Physical Exam Constitutional:      General: She is not in acute distress. Musculoskeletal:        General: No deformity.  Neurological:     Mental Status: She is alert and oriented to person, place, and time.     Coordination: Coordination normal.  Psychiatric:        Attention and Perception: Attention and perception normal. She does not perceive auditory or visual hallucinations.        Mood and Affect: Mood normal. Mood is not anxious or depressed. Affect is not labile, blunt, angry or inappropriate.        Speech: Speech normal.        Behavior: Behavior normal.        Thought Content: Thought content normal. Thought content is not paranoid or delusional. Thought content does not include homicidal or suicidal ideation. Thought content does not include homicidal or suicidal plan.        Cognition and Memory: Cognition and memory normal.        Judgment: Judgment normal.     Comments: Insight intact    Lab Review:     Component Value Date/Time   NA 136 06/25/2015 0805   K 3.5 06/25/2015 0805   CL 107 06/25/2015 0805   CO2 25 06/25/2015 0805   GLUCOSE 76 06/25/2015 0805   BUN 8 06/25/2015 0805   CREATININE 0.64 06/25/2015 0805   CALCIUM 8.5 (L) 06/25/2015 0805   PROT 6.3 (L) 06/25/2015 0805   ALBUMIN 2.8 (L) 06/25/2015 0805   AST 18 06/25/2015 0805   ALT 10 (L) 06/25/2015 0805   ALKPHOS 155 (H) 06/25/2015 0805   BILITOT 0.4 06/25/2015 0805   GFRNONAA >60 06/25/2015 0805   GFRAA >60 06/25/2015 0805       Component Value Date/Time   WBC 11.6 (H) 06/26/2015 0502   RBC 3.59 (L) 06/26/2015 0502   HGB 11.1 (L) 06/26/2015 0502   HCT 32.0 (L) 06/26/2015 0502   PLT 223 06/26/2015 0502   MCV 89.1 06/26/2015 0502   MCH 30.9 06/26/2015 0502   MCHC 34.7 06/26/2015 0502   RDW 13.1 06/26/2015 0502    No  results found for: POCLITH, LITHIUM   No results found for: PHENYTOIN, PHENOBARB, VALPROATE, CBMZ   .res Assessment: Plan:   Pt seen for 30 minutes and time spent discussing potential benefits, risks, and side effects of decreasing Sertraline to 75 mg po qd to potentially minimize risk of affective dulling. Pt agrees to dose reduction. Discussed that Sertraline can be gradually decreased based upon response.  Continue Wellbutrin XL 150 mg po qd for depression.  Discussed potential benefits of therapy to process traumatic events and recent psychosocial stressors and changes. Pt agrees to starting therapy and referral made to [facility], [facility].  Pt to follow-up in 6 weeks or sooner if clinically indicated.  Patient advised to contact office with any questions, adverse effects, or acute worsening in signs and symptoms.   Audriana was seen today for follow-up and medication problem.  Diagnoses and all orders for this visit:  Anxiety disorder, unspecified type -     sertraline (ZOLOFT) 50 MG tablet; Take 1.5 tablets (75 mg total) by mouth daily.  Depression, unspecified depression type -     sertraline (ZOLOFT) 50 MG tablet; Take 1.5 tablets (75 mg total) by mouth daily.    Please see After Visit Summary for patient specific instructions.  Future Appointments  Date Time Provider Department Center  01/03/2021  9:30 AM Corie Chiquito, PMHNP [facility] None  01/08/2021 11:00 AM Stevphen Meuse, [facility] None    No orders of the defined types were placed in this encounter.   -------------------------------

## 2021-01-03 ENCOUNTER — Ambulatory Visit: Payer: 59 | Admitting: Psychiatry

## 2021-01-08 ENCOUNTER — Other Ambulatory Visit: Payer: Self-pay

## 2021-01-08 ENCOUNTER — Ambulatory Visit (INDEPENDENT_AMBULATORY_CARE_PROVIDER_SITE_OTHER): Payer: 59 | Admitting: Psychiatry

## 2021-01-08 ENCOUNTER — Encounter: Payer: Self-pay | Admitting: Psychiatry

## 2021-01-08 DIAGNOSIS — F4310 Post-traumatic stress disorder, unspecified: Secondary | ICD-10-CM
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
## 2021-01-22 ENCOUNTER — Other Ambulatory Visit: Payer: Self-pay

## 2021-01-22 ENCOUNTER — Ambulatory Visit (INDEPENDENT_AMBULATORY_CARE_PROVIDER_SITE_OTHER): Payer: 59 | Admitting: Psychiatry

## 2021-01-22 ENCOUNTER — Encounter: Payer: Self-pay | Admitting: Psychiatry

## 2021-01-22 VITALS — BP 107/62 | HR 66

## 2021-01-22 DIAGNOSIS — F902 Attention-deficit hyperactivity disorder, combined type: Secondary | ICD-10-CM | POA: Diagnosis not present

## 2021-01-22 DIAGNOSIS — F32A Depression, unspecified: Secondary | ICD-10-CM | POA: Diagnosis not present

## 2021-01-22 DIAGNOSIS — F419 Anxiety disorder, unspecified: Secondary | ICD-10-CM | POA: Diagnosis not present

## 2021-01-22 MED ORDER — LISDEXAMFETAMINE DIMESYLATE 30 MG PO CAPS: 30.0000 mg | ORAL_CAPSULE | Freq: Every day | ORAL | 0 refills | Status: DC

## 2021-01-22 MED ORDER — BUPROPION HCL ER (XL) 150 MG PO TB24
150.0000 mg | ORAL_TABLET | Freq: Every day | ORAL | 1 refills | Status: DC
Start: 2021-01-22 — End: 2021-03-05

## 2021-01-22 MED ORDER — SERTRALINE HCL 50 MG PO TABS: 50.0000 mg | ORAL_TABLET | Freq: Every day | ORAL | 0 refills | Status: DC

## 2021-01-22 NOTE — Progress Notes (Signed)
Peggy Sanchez [id number] [birth year] 35 y.o.  Subjective:   Patient ID:  Peggy Sanchez is a 35 y.o. (DOB [birth year]) female.  Chief Complaint:  Chief Complaint  Patient presents with   ADHD   Follow-up    Anxiety    HPI Peggy Sanchez presents to the office today for follow-up of anxiety, h/o depression, and ADHD. She reports that she has been taking Sertraline 50 mg po qd for one month and reports that her mood and anxiety have been stable. "I feel like I can feel my feelings more." Denies any anxiety that is not controlled. Denies depressed mood or significant irritability. Sleeping well. Appetite has been good. She reports wanting to accomplish tasks but not sure where to start. Denies SI.   She reports that she continues to have "fogginess" despite decrease in dose. She also notices more ADHD s/s. 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 She has been having difficulty keeping up with some financial reports. She reports less hyperactivity compared to college- "but I am still high energy." She reports that she has been forgetting some commitments. Difficulty prioritizing.  Oldest son has been at therapeutic boarding school.   Has not taken Klonopin prn in over a year.  Past Psychiatric Medication Trials: Sertraline- Helped initially for several years at 25 mg and then increased to 50 mg daily.  Prozac- Helped initially. Was on 20 mg and increased to 30 mg at the start of the pandemic. Dose was increased to 40 mg and now feels "exhausted" and wants to sleep. No longer seems to be as helpful. Has been more tired on 40 mg with some improvement.  Klonopin Adderall XR- took 20 mg in college and at one point was on 20 mg BID Had some anxiety.    Review of Systems:  Review of Systems  Gastrointestinal: Negative.   Musculoskeletal:  Negative for gait  problem.  Neurological:  Negative for tremors and headaches.  Psychiatric/Behavioral:         Please refer to HPI   Medications: I have reviewed the patient's current medications.  Current Outpatient Medications  Medication Sig Dispense Refill   clonazePAM (KLONOPIN) 0.5 MG tablet Take 1/2-1 tab po qd prn panic attacks 10 tablet 0   lisdexamfetamine (VYVANSE) 30 MG capsule Take 1 capsule (30 mg total) by mouth daily. 30 capsule 0   [name] 3-0.02 MG tablet Take 1 tablet by mouth daily.     buPROPion (WELLBUTRIN XL) 150 MG 24 hr tablet Take 1 tablet (150 mg total) by mouth daily. 90 tablet 1   meloxicam (MOBIC) 15 MG tablet One tab PO qAM with a meal for 2 weeks, then daily prn pain. (Patient not taking: Reported on 11/22/2020) 30 tablet 3   Misc. Devices (ALL-BODY MASSAGE) MISC Full body and paracervical massage 3 times weekly, medically necessary. 1 each 0   oxybutynin (DITROPAN-XL) 5 MG 24 hr tablet Take 5 mg by mouth daily. (Patient not taking: No sig reported)     predniSONE (DELTASONE) 50 MG tablet One tab PO daily for 5 days. (Patient not taking: No sig reported) 5 tablet 0   sertraline (ZOLOFT) 50 MG tablet Take 1 tablet (50 mg total) by mouth daily. 90 tablet 0   triamcinolone cream (KENALOG) 0.5 % Apply 1 application topically 2 (two) times daily. To affected areas. (Patient  not taking: Reported on 11/22/2020) 30 g 3   No current facility-administered medications for this visit.    Medication Side Effects: Other: Mild affective  Allergies: No Known Allergies  Past Medical History:  Diagnosis Date   Dental crowns present    Difficult intravenous access    Indication for care in labor or delivery 06/25/2015   Interstitial cystitis    Medial meniscus tear 04/2014   left knee   Postpartum care following vaginal delivery (4/4) 06/25/2015   Pregnancy with third trimester bleeding, antepartum 06/25/2015    Past Medical History, Surgical history, Social history, and Family history were  reviewed and updated as appropriate.   Please see review of systems for further details on the patient's review from today.   Objective:   Physical Exam:  BP 107/62   Pulse 66   Physical Exam Constitutional:      General: She is not in acute distress. Musculoskeletal:        General: No deformity.  Neurological:     Mental Status: She is alert and oriented to person, place, and time.     Coordination: Coordination normal.  Psychiatric:        Attention and Perception: Attention and perception normal. She does not perceive auditory or visual hallucinations.        Mood and Affect: Mood normal. Mood is not anxious or depressed. Affect is not labile, blunt, angry or inappropriate.        Speech: Speech normal.        Behavior: Behavior normal.        Thought Content: Thought content normal. Thought content is not paranoid or delusional. Thought content does not include homicidal or suicidal ideation. Thought content does not include homicidal or suicidal plan.        Cognition and Memory: Cognition and memory normal.        Judgment: Judgment normal.     Comments: Insight intact    Lab Review:     Component Value Date/Time   NA 136 06/25/2015 0805   K 3.5 06/25/2015 0805   CL 107 06/25/2015 0805   CO2 25 06/25/2015 0805   GLUCOSE 76 06/25/2015 0805   BUN 8 06/25/2015 0805   CREATININE 0.64 06/25/2015 0805   CALCIUM 8.5 (L) 06/25/2015 0805   PROT 6.3 (L) 06/25/2015 0805   ALBUMIN 2.8 (L) 06/25/2015 0805   AST 18 06/25/2015 0805   ALT 10 (L) 06/25/2015 0805   ALKPHOS 155 (H) 06/25/2015 0805   BILITOT 0.4 06/25/2015 0805   GFRNONAA >60 06/25/2015 0805   GFRAA >60 06/25/2015 0805       Component Value Date/Time   WBC 11.6 (H) 06/26/2015 0502   RBC 3.59 (L) 06/26/2015 0502   HGB 11.1 (L) 06/26/2015 0502   HCT 32.0 (L) 06/26/2015 0502   PLT 223 06/26/2015 0502   MCV 89.1 06/26/2015 0502   MCH 30.9 06/26/2015 0502   MCHC 34.7 06/26/2015 0502   RDW 13.1 06/26/2015  0502    No results found for: POCLITH, LITHIUM   No results found for: PHENYTOIN, PHENOBARB, VALPROATE, CBMZ   .res Assessment: Plan:    Pt seen for 30 minutes and time spent discussing potential benefits and risks of re-starting treatment for ADHD since she is noticing ADHD s/s have been more impactful with managing multiple responsibilities. Discussed potential benefits, risks, and side effects of stimulants with patient to include increased heart rate, palpitations, insomnia, increased anxiety, increased irritability, or decreased appetite.  Instructed patient to contact office if experiencing any significant tolerability issues. Discussed option of starting Vyvanse since it is pharmacologically similar to Adderall XR, which she responded well to in the past, but may be less likely to exacerbate anxiety due to gradual onset. Pt agrees to trial of Vyvanse 30 mg po q am for ADHD.  Will continue Wellbutrin XL 150 mg po qd and will consider possible discontinuation based on response to Vyvanse since Wellbutrin XL is used off-label for treatment of ADHD.  Continue Sertraline 50 mg po qd for anxiety.  Pt to follow-up in 6 weeks or sooner if clinically indicated.  Patient advised to contact office with any questions, adverse effects, or acute worsening in signs and symptoms.    Peggy Sanchez was seen today for adhd and follow-up.  Diagnoses and all orders for this visit:  Attention deficit hyperactivity disorder (ADHD), combined type -     lisdexamfetamine (VYVANSE) 30 MG capsule; Take 1 capsule (30 mg total) by mouth daily.  Depression, unspecified depression type -     buPROPion (WELLBUTRIN XL) 150 MG 24 hr tablet; Take 1 tablet (150 mg total) by mouth daily. -     sertraline (ZOLOFT) 50 MG tablet; Take 1 tablet (50 mg total) by mouth daily.  Anxiety disorder, unspecified type -     sertraline (ZOLOFT) 50 MG tablet; Take 1 tablet (50 mg total) by mouth daily.    Please see After Visit Summary  for patient specific instructions.  Future Appointments  Date Time Provider Department Center  03/05/2021  9:00 AM Corie Chiquito, PMHNP CP-CP None  03/11/2021 12:00 PM Stevphen Meuse, [facility] CP-CP None  03/25/2021  9:00 AM Stevphen Meuse, [facility] CP-CP None  04/08/2021  9:00 AM Stevphen Meuse, [facility] CP-CP None    No orders of the defined types were placed in this encounter.   -------------------------------

## 2021-02-11 ENCOUNTER — Telehealth: Payer: Self-pay | Admitting: Psychiatry

## 2021-02-11 DIAGNOSIS — F902 Attention-deficit hyperactivity disorder, combined type: Secondary | ICD-10-CM

## 2021-02-11 NOTE — Telephone Encounter (Signed)
Pt called and said that the vyvanse 30 mg she has been on it for 20 days. She says it is helping but she would like to increase the vyvanse to do even better. Please give her a call to let her know that can be done at [phone]

## 2021-02-11 NOTE — Telephone Encounter (Signed)
Please find out more- ie., what needs improvement (effect, duration, etc), any side effects, any worsening in other s/s (anxiety, sleep, etc)?

## 2021-02-11 NOTE — Telephone Encounter (Signed)
Please review

## 2021-02-12 MED ORDER — LISDEXAMFETAMINE DIMESYLATE 40 MG PO CAPS: 40.0000 mg | ORAL_CAPSULE | Freq: Every day | ORAL | 0 refills | Status: DC

## 2021-02-12 NOTE — Telephone Encounter (Signed)
LVM with info

## 2021-02-12 NOTE — Telephone Encounter (Signed)
Pt stated she has no side effects or worsening symptoms.She thinks the effect of the med can be improved and stated she discussed with you last visit about possibly increasing.

## 2021-02-12 NOTE — Telephone Encounter (Signed)
Please let her know that Vyvanse 40 mg was sent to her pharmacy that can be filled 02/19/21.

## 2021-02-24 ENCOUNTER — Other Ambulatory Visit: Payer: Self-pay | Admitting: Psychiatry

## 2021-02-24 DIAGNOSIS — F32A Depression, unspecified: Secondary | ICD-10-CM

## 2021-02-24 DIAGNOSIS — F419 Anxiety disorder, unspecified: Secondary | ICD-10-CM

## 2021-03-05 ENCOUNTER — Other Ambulatory Visit: Payer: Self-pay

## 2021-03-05 ENCOUNTER — Encounter: Payer: Self-pay | Admitting: Psychiatry

## 2021-03-05 ENCOUNTER — Ambulatory Visit (INDEPENDENT_AMBULATORY_CARE_PROVIDER_SITE_OTHER): Payer: 59 | Admitting: Psychiatry

## 2021-03-05 DIAGNOSIS — F32A Depression, unspecified: Secondary | ICD-10-CM | POA: Diagnosis not present

## 2021-03-05 DIAGNOSIS — F419 Anxiety disorder, unspecified: Secondary | ICD-10-CM

## 2021-03-05 DIAGNOSIS — F902 Attention-deficit hyperactivity disorder, combined type: Secondary | ICD-10-CM | POA: Diagnosis not present

## 2021-03-05 MED ORDER — LISDEXAMFETAMINE DIMESYLATE 30 MG PO CAPS
30.0000 mg | ORAL_CAPSULE | Freq: Every day | ORAL | 0 refills | Status: DC
Start: 2021-04-02 — End: 2021-06-03

## 2021-03-05 MED ORDER — LISDEXAMFETAMINE DIMESYLATE 30 MG PO CAPS: 30.0000 mg | ORAL_CAPSULE | Freq: Every day | ORAL | 0 refills | Status: DC

## 2021-03-05 MED ORDER — SERTRALINE HCL 50 MG PO TABS: 50.0000 mg | ORAL_TABLET | Freq: Every day | ORAL | 0 refills | Status: DC

## 2021-03-05 MED ORDER — LISDEXAMFETAMINE DIMESYLATE 30 MG PO CAPS
30.0000 mg | ORAL_CAPSULE | Freq: Every day | ORAL | 0 refills | Status: DC
Start: 2021-04-30 — End: 2021-06-03

## 2021-03-05 NOTE — Progress Notes (Signed)
Peggy Sanchez [id number] Oct 14, 1985 35 y.o.  Subjective:   Patient ID:  Peggy Sanchez is a 35 y.o. (DOB 11-13-1985) female.  Chief Complaint:  Chief Complaint  Patient presents with   Follow-up    ADHD, Anxiety     HPI Peggy Sanchez presents to the office today for follow-up of ADHD and anxiety. She is accompanied by her son.   She is not sure if she notices a difference in Vyvanse from 30 mg to 40 mg other than increased dry mouth. She reports that she would prefer to resume 30 mg. She reports that she has been productive and able to focus. Denies any anxiety. Denies current depression. Energy and motivation have been good. Sleeping well. Appetite has been good. Denies SI.   She is interested in trying to discontinue Wellbutrin since she has not had any recent depression and attributes past depression to situational stressors.  She has not needed Klonopin prn.  She has accepted a new position and will start in the new year.    Past Psychiatric Medication Trials: Sertraline- Helped initially for several years at 25 mg and then increased to 50 mg daily.  Prozac- Helped initially. Was on 20 mg and increased to 30 mg at the start of the pandemic. Dose was increased to 40 mg and now feels "exhausted" and wants to sleep. No longer seems to be as helpful. Has been more tired on 40 mg with some improvement.  Klonopin Adderall XR- took 20 mg in college and at one point was on 20 mg BID Had some anxiety.  Review of Systems:  Review of Systems  Musculoskeletal:  Negative for gait problem.  Neurological:  Negative for tremors.  Psychiatric/Behavioral:         Please refer to HPI   Medications: I have reviewed the patient's current medications.  Current Outpatient Medications  Medication Sig Dispense Refill   azithromycin (ZITHROMAX) 250 MG tablet Take 250 mg by mouth as directed.     [START ON 04/02/2021] lisdexamfetamine (VYVANSE) 30 MG capsule Take 1 capsule (30 mg total) by mouth  daily. 30 capsule 0   [START ON 04/30/2021] lisdexamfetamine (VYVANSE) 30 MG capsule Take 1 capsule (30 mg total) by mouth daily. 30 capsule 0   [name] 3-0.02 MG tablet Take 1 tablet by mouth daily.     predniSONE (DELTASONE) 50 MG tablet One tab PO daily for 5 days. 5 tablet 0   clonazePAM (KLONOPIN) 0.5 MG tablet Take 1/2-1 tab po qd prn panic attacks 10 tablet 0   lisdexamfetamine (VYVANSE) 30 MG capsule Take 1 capsule (30 mg total) by mouth daily. 30 capsule 0   meloxicam (MOBIC) 15 MG tablet One tab PO qAM with a meal for 2 weeks, then daily prn pain. (Patient not taking: Reported on 11/22/2020) 30 tablet 3   Misc. Devices (ALL-BODY MASSAGE) MISC Full body and paracervical massage 3 times weekly, medically necessary. 1 each 0   oxybutynin (DITROPAN-XL) 5 MG 24 hr tablet Take 5 mg by mouth daily. (Patient not taking: Reported on 10/10/2019)     sertraline (ZOLOFT) 50 MG tablet Take 1 tablet (50 mg total) by mouth daily. 90 tablet 0   triamcinolone cream (KENALOG) 0.5 % Apply 1 application topically 2 (two) times daily. To affected areas. (Patient not taking: Reported on 11/22/2020) 30 g 3   No current facility-administered medications for this visit.    Medication Side Effects: Dry mouth  Allergies: No Known Allergies  Past Medical History:  Diagnosis Date  Dental crowns present    Difficult intravenous access    Indication for care in labor or delivery 06/25/2015   Interstitial cystitis    Medial meniscus tear 04/2014   left knee   Postpartum care following vaginal delivery (4/4) 06/25/2015   Pregnancy with third trimester bleeding, antepartum 06/25/2015    Past Medical History, Surgical history, Social history, and Family history were reviewed and updated as appropriate.   Please see review of systems for further details on the patient's review from today.   Objective:   Physical Exam:  BP 116/63    Pulse 74   Physical Exam Constitutional:      General: She is not in acute  distress. Musculoskeletal:        General: No deformity.  Neurological:     Mental Status: She is alert and oriented to person, place, and time.     Coordination: Coordination normal.  Psychiatric:        Attention and Perception: Attention and perception normal. She does not perceive auditory or visual hallucinations.        Mood and Affect: Mood normal. Mood is not anxious or depressed. Affect is not labile, blunt, angry or inappropriate.        Speech: Speech normal.        Behavior: Behavior normal.        Thought Content: Thought content normal. Thought content is not paranoid or delusional. Thought content does not include homicidal or suicidal ideation. Thought content does not include homicidal or suicidal plan.        Cognition and Memory: Cognition and memory normal.        Judgment: Judgment normal.     Comments: Insight intact    Lab Review:     Component Value Date/Time   NA 136 06/25/2015 0805   K 3.5 06/25/2015 0805   CL 107 06/25/2015 0805   CO2 25 06/25/2015 0805   GLUCOSE 76 06/25/2015 0805   BUN 8 06/25/2015 0805   CREATININE 0.64 06/25/2015 0805   CALCIUM 8.5 (L) 06/25/2015 0805   PROT 6.3 (L) 06/25/2015 0805   ALBUMIN 2.8 (L) 06/25/2015 0805   AST 18 06/25/2015 0805   ALT 10 (L) 06/25/2015 0805   ALKPHOS 155 (H) 06/25/2015 0805   BILITOT 0.4 06/25/2015 0805   GFRNONAA >60 06/25/2015 0805   GFRAA >60 06/25/2015 0805       Component Value Date/Time   WBC 11.6 (H) 06/26/2015 0502   RBC 3.59 (L) 06/26/2015 0502   HGB 11.1 (L) 06/26/2015 0502   HCT 32.0 (L) 06/26/2015 0502   PLT 223 06/26/2015 0502   MCV 89.1 06/26/2015 0502   MCH 30.9 06/26/2015 0502   MCHC 34.7 06/26/2015 0502   RDW 13.1 06/26/2015 0502    No results found for: POCLITH, LITHIUM   No results found for: PHENYTOIN, PHENOBARB, VALPROATE, CBMZ   .res Assessment: Plan:    Will discontinue Wellbutrin XL since she has not had any recent depressive signs and symptoms and past  depression seemed to have been mostly situational and there have been multiple changes since that time. Continue Vyvanse 30 mg daily for attention deficit disorder. Continue sertraline 50 mg daily for anxiety. Continue Klonopin as needed for anxiety. Pt to follow-up in 3 months or sooner if clinically indicated.  Patient advised to contact office with any questions, adverse effects, or acute worsening in signs and symptoms.   Nahiara was seen today for follow-up.  Diagnoses and all orders  for this visit:  Attention deficit hyperactivity disorder (ADHD), combined type -     lisdexamfetamine (VYVANSE) 30 MG capsule; Take 1 capsule (30 mg total) by mouth daily. -     lisdexamfetamine (VYVANSE) 30 MG capsule; Take 1 capsule (30 mg total) by mouth daily. -     lisdexamfetamine (VYVANSE) 30 MG capsule; Take 1 capsule (30 mg total) by mouth daily.  Depression, unspecified depression type -     sertraline (ZOLOFT) 50 MG tablet; Take 1 tablet (50 mg total) by mouth daily.  Anxiety disorder, unspecified type -     sertraline (ZOLOFT) 50 MG tablet; Take 1 tablet (50 mg total) by mouth daily.     Please see After Visit Summary for patient specific instructions.  Future Appointments  Date Time Provider Department Center  03/11/2021 12:00 PM Stevphen Meuse, [facility] CP-CP None  03/25/2021  9:00 AM Stevphen Meuse, [facility] CP-CP None  04/08/2021  9:00 AM Stevphen Meuse, [facility] CP-CP None  06/03/2021  8:30 AM Corie Chiquito, PMHNP CP-CP None    No orders of the defined types were placed in this encounter.    -------------------------------

## 2021-03-11 ENCOUNTER — Other Ambulatory Visit: Payer: Self-pay

## 2021-03-11 ENCOUNTER — Ambulatory Visit (INDEPENDENT_AMBULATORY_CARE_PROVIDER_SITE_OTHER): Payer: 59 | Admitting: Psychiatry

## 2021-03-11 DIAGNOSIS — F4310 Post-traumatic stress disorder, unspecified: Secondary | ICD-10-CM | POA: Diagnosis not present
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
## 2021-03-25 ENCOUNTER — Other Ambulatory Visit: Payer: Self-pay

## 2021-03-25 ENCOUNTER — Ambulatory Visit (INDEPENDENT_AMBULATORY_CARE_PROVIDER_SITE_OTHER): Payer: 59 | Admitting: Psychiatry

## 2021-03-25 ENCOUNTER — Telehealth: Payer: Self-pay | Admitting: Psychiatry

## 2021-03-25 DIAGNOSIS — F4310 Post-traumatic stress disorder, unspecified: Secondary | ICD-10-CM | POA: Diagnosis not present

## 2021-03-25 DIAGNOSIS — F32A Depression, unspecified: Secondary | ICD-10-CM

## 2021-03-25 MED ORDER — BUPROPION HCL ER (XL) 150 MG PO TB24: 150.0000 mg | ORAL_TABLET | Freq: Every day | ORAL | 1 refills | Status: DC

## 2021-03-25 NOTE — Telephone Encounter (Signed)
Vyvanse PA is pending, new insurance has different formularies

## 2021-03-25 NOTE — Progress Notes (Signed)
[facility] Counselor/Therapist Progress Note  Patient ID: Peggy Sanchez, MRN: [id number],    Date: 03/25/2021  Time Spent: 51 minutes start time 9:08 AM end time 9:59 AM  Treatment Type: Individual Therapy  Reported Symptoms: anxiety, focusing issues, irritability,sadness, crying spells, triggered responses, flashbacks  Mental Status Exam:  Appearance:   Casual     Behavior:  Appropriate  Motor:  Normal  Speech/Language:   Normal Rate  Affect:  Appropriate  Mood:  anxious  Thought process:  normal  Thought content:    WNL  Sensory/Perceptual disturbances:    WNL  Orientation:  oriented to person, place, time/date, and situation  Attention:  Good  Concentration:  Good  Memory:  WNL  Fund of knowledge:   Good  Insight:    Good  Judgment:   Good  Impulse Control:  Good   Risk Assessment: Danger to Self:  No Self-injurious Behavior: No Danger to Others: No Duty to Warn:no Physical Aggression / Violence:No  Access to Firearms a concern: No  Gang Involvement:No   Subjective: Patient was present for session.  She shared that she was struggling.  She went on to share that having her son Keenan Bachelor for a week was very difficult and she found herself getting irritable so she went back on her Wellbutrin.  She asked that message be sent to her provider Thayer Headings, PMH, NP.  She also shared that she would need a new prescription for her Vyvanse due to a change in insurance company she will need a prior authorization.  Patient went on to explain that there were good and bad moments over the holidays especially with [facility].  She explained that at times she is thinking about [place] and wishing she could move back there.  Discussed how the memories were very triggering and that could be very overwhelming and not healthy for she or the children.  Patient also discussed that her middle child is having difficulties with the transition of her having a boyfriend  and not being willing to reconcile with his father.  Patient shared that as she reflects she has noticed all of the negative emotions that are attached to being in that relationship and she is not willing to go back.  Also she does not feel that it is safe for everyone to live in the same house again and is more hopeful that he will do a switch with her concerning having to with the children 1 week and then [facility] who has the extreme emotional and aggressive behaviors on his own to help keep the other children safe.  Patient was encouraged to think through how to communicate her concerns and reasons for the situation with her son.  Also discussed finding ways that she and he can have one-on-one time to help him be able to get needs met in an appropriate manner.  Patient was encouraged to focus on her own self-care as she goes to the transitions and continue thinking through what she would like to do when [place] returns for the summer.  Interventions: Cognitive Behavioral Therapy and Solution-Oriented/Positive Psychology  Diagnosis:   ICD-10-CM   1. PTSD (post-traumatic stress disorder)  F43.10       Plan: Patient is to use CBT and coping skills to decrease triggered responses.  Patient is to continue thinking through options for her son Keenan Bachelor rather than him being with her and his 2 brothers over the summer.  Patient is to  continue exercising to release negative emotions appropriately.  Patient is to follow plans to talk with her son Ky Barban about his feelings and develop a plan to make sure they have one-on-one time together.  Patient is to take medication as directed. Long-term goal: Recall the traumatic event without becoming overwhelming negative emotions Short-term goal: Identify the symptoms of PTSD that have caused distress and impaired functioning  Lina Sayre, [facility]

## 2021-03-25 NOTE — Telephone Encounter (Signed)
Wellbutrin script sent. Traci, would you please initiate a PA for Vyvanse? Thanks

## 2021-03-25 NOTE — Telephone Encounter (Signed)
-----   Message from [place], [facility] sent at 03/25/2021  9:09 AM EST ----- Regarding: needs a prior authorization Patient reported that she wanted you to know that she was taking Wellbutrin 150 mg again and she will need another prescription for that called in but she has some currently.    She also changed insurance company new, information should have been put in the system, and will need a new prior authorization for Vyvance which she is currently out of and needs as quickly as possible.

## 2021-03-26 NOTE — Telephone Encounter (Signed)
Prior Approval received for VYVANSE 30 MG effective 03/25/2021-03/25/2022 with [employer], PA# [id number]   Notified pt via voicemail and to call back with further concerns.

## 2021-04-08 ENCOUNTER — Ambulatory Visit: Payer: 59 | Admitting: Psychiatry

## 2021-04-25 ENCOUNTER — Telehealth: Payer: Self-pay | Admitting: Psychiatry
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
## 2021-05-08 ENCOUNTER — Telehealth: Payer: Self-pay | Admitting: Psychiatry

## 2021-05-08 NOTE — Telephone Encounter (Signed)
Left message trying to return call

## 2021-05-22 ENCOUNTER — Ambulatory Visit: Payer: 59 | Admitting: Psychiatry

## 2021-06-03 ENCOUNTER — Encounter: Payer: Self-pay | Admitting: Psychiatry

## 2021-06-03 ENCOUNTER — Other Ambulatory Visit: Payer: Self-pay

## 2021-06-03 ENCOUNTER — Telehealth: Payer: Self-pay

## 2021-06-03 ENCOUNTER — Ambulatory Visit (INDEPENDENT_AMBULATORY_CARE_PROVIDER_SITE_OTHER): Payer: No Typology Code available for payment source | Admitting: Psychiatry

## 2021-06-03 DIAGNOSIS — F419 Anxiety disorder, unspecified: Secondary | ICD-10-CM

## 2021-06-03 DIAGNOSIS — F32A Depression, unspecified: Secondary | ICD-10-CM | POA: Diagnosis not present

## 2021-06-03 DIAGNOSIS — F902 Attention-deficit hyperactivity disorder, combined type: Secondary | ICD-10-CM | POA: Diagnosis not present

## 2021-06-03 MED ORDER — LISDEXAMFETAMINE DIMESYLATE 30 MG PO CAPS: 30.0000 mg | ORAL_CAPSULE | Freq: Every day | ORAL | 0 refills | Status: DC

## 2021-06-03 MED ORDER — SERTRALINE HCL 50 MG PO TABS: 50.0000 mg | ORAL_TABLET | Freq: Every day | ORAL | 1 refills | Status: DC

## 2021-06-03 MED ORDER — BUPROPION HCL ER (XL) 150 MG PO TB24: 150.0000 mg | ORAL_TABLET | Freq: Every day | ORAL | 1 refills | Status: DC

## 2021-06-03 NOTE — Progress Notes (Signed)
Peggy StallSarah Portnoy ?[id number] ?[birth year] ?36 y.o. ? ?Subjective:  ? ?Patient ID:  Peggy Sanchez is a 36 y.o. (DOB [birth year]) female. ? ?Chief Complaint:  ?Chief Complaint  ?Patient presents with  ? Follow-up  ?  Anxiety, ADHD  ? ? ?HPI ?Peggy StallSarah Coyne presents to the office today for follow-up of anxiety, depression, and ADHD. She reports that she has been doing well overall. She reports some stressors to include separation/divorce. She reports that anxiety has been manageable. Denies depression. She noticed mood changes with trying to stop Wellbutrin. She reports that Vyvanse 30 mg remains effective for her concentration. Denies any panic attacks. Sleeping well. Appetite has been good. Energy and motivation have been good. Denies SI.  ? ?Son will be coming home from [employer] school in April and is not sure about the plan. She reports that son's behaviors have been escalating. She reports her youngest sons are doing well.  ? ?She reports that she enjoys her new job. ? ?Has not used Klonopin prn recently.  ? ?Review of Systems:  ?Review of Systems  ?Cardiovascular:  Negative for palpitations.  ?Musculoskeletal:  Negative for gait problem.  ?Neurological:  Negative for tremors.  ?Psychiatric/Behavioral:    ?     Please refer to HPI  ? ?Medications: I have reviewed the patient's current medications. ? ?Current Outpatient Medications  ?Medication Sig Dispense Refill  ? clonazePAM (KLONOPIN) 0.5 MG tablet Take 1/2-1 tab po qd prn panic attacks 10 tablet 0  ? [name] 3-0.02 MG tablet Take 1 tablet by mouth daily.    ? buPROPion (WELLBUTRIN XL) 150 MG 24 hr tablet Take 1 tablet (150 mg total) by mouth daily. 90 tablet 1  ? [START ON 08/17/2021] lisdexamfetamine (VYVANSE) 30 MG capsule Take 1 capsule (30 mg total) by mouth daily. 30 capsule 0  ? [START ON 07/20/2021] lisdexamfetamine (VYVANSE) 30 MG capsule Take 1 capsule (30 mg total) by mouth daily. 30 capsule 0  ? [START ON 06/22/2021] lisdexamfetamine (VYVANSE) 30 MG capsule Take 1  capsule (30 mg total) by mouth daily. 30 capsule 0  ? Misc. Devices (ALL-BODY MASSAGE) MISC Full body and paracervical massage 3 times weekly, medically necessary. 1 each 0  ? sertraline (ZOLOFT) 50 MG tablet Take 1 tablet (50 mg total) by mouth daily. 90 tablet 1  ? triamcinolone cream (KENALOG) 0.5 % Apply 1 application topically 2 (two) times daily. To affected areas. (Patient not taking: Reported on 11/22/2020) 30 g 3  ? ?No current facility-administered medications for this visit.  ? ? ?Medication Side Effects: None ? ?Allergies: No Known Allergies ? ?Past Medical History:  ?Diagnosis Date  ? Dental crowns present   ? Difficult intravenous access   ? Indication for care in labor or delivery 06/25/2015  ? Interstitial cystitis   ? Medial meniscus tear 04/2014  ? left knee  ? Postpartum care following vaginal delivery (4/4) 06/25/2015  ? Pregnancy with third trimester bleeding, antepartum 06/25/2015  ? ? ?Past Medical History, Surgical history, Social history, and Family history were reviewed and updated as appropriate.  ? ?Please see review of systems for further details on the patient's review from today.  ? ?Objective:  ? ?Physical Exam:  ?BP 105/64   Pulse 70  ? ?Physical Exam ?Constitutional:   ?   General: She is not in acute distress. ?Musculoskeletal:     ?   General: No deformity.  ?Neurological:  ?   Mental Status: She is alert and oriented to person, place, and time.  ?  Coordination: Coordination normal.  ?Psychiatric:     ?   Attention and Perception: Attention and perception normal. She does not perceive auditory or visual hallucinations.     ?   Mood and Affect: Mood normal. Mood is not anxious or depressed. Affect is not labile, blunt, angry or inappropriate.     ?   Speech: Speech normal.     ?   Behavior: Behavior normal.     ?   Thought Content: Thought content normal. Thought content is not paranoid or delusional. Thought content does not include homicidal or suicidal ideation. Thought content does  not include homicidal or suicidal plan.     ?   Cognition and Memory: Cognition and memory normal.     ?   Judgment: Judgment normal.  ?   Comments: Insight intact  ? ? ?Lab Review:  ?   ?Component Value Date/Time  ? NA 136 06/25/2015 0805  ? K 3.5 06/25/2015 0805  ? CL 107 06/25/2015 0805  ? CO2 25 06/25/2015 0805  ? GLUCOSE 76 06/25/2015 0805  ? BUN 8 06/25/2015 0805  ? CREATININE 0.64 06/25/2015 0805  ? CALCIUM 8.5 (L) 06/25/2015 0805  ? PROT 6.3 (L) 06/25/2015 0805  ? ALBUMIN 2.8 (L) 06/25/2015 0805  ? AST 18 06/25/2015 0805  ? ALT 10 (L) 06/25/2015 0805  ? ALKPHOS 155 (H) 06/25/2015 0805  ? BILITOT 0.4 06/25/2015 0805  ? GFRNONAA >60 06/25/2015 0805  ? GFRAA >60 06/25/2015 0805  ? ? ?   ?Component Value Date/Time  ? WBC 11.6 (H) 06/26/2015 0502  ? RBC 3.59 (L) 06/26/2015 0502  ? HGB 11.1 (L) 06/26/2015 0502  ? HCT 32.0 (L) 06/26/2015 0502  ? PLT 223 06/26/2015 0502  ? MCV 89.1 06/26/2015 0502  ? MCH 30.9 06/26/2015 0502  ? MCHC 34.7 06/26/2015 0502  ? RDW 13.1 06/26/2015 0502  ? ? ?No results found for: POCLITH, LITHIUM  ? ?No results found for: PHENYTOIN, PHENOBARB, VALPROATE, CBMZ  ? ?.res ?Assessment: Plan:   ? ?Pt seen for 30 minutes and time spent discussing cost of Vyvanse and that she may need a PA under her new insurance. Contacted office nurse and she will follow-up if PA is needed or if there is another issue with insurance.  ?Will continue Vyvanse 30 mg po q am for ADHD.  ?Continue Wellbutrin XL 150 mg po qd for depression.  ?Continue Sertraline 50 mg po qd for anxiety and depression.  ?Recommend continuing therapy with Stevphen Meuse, [facility].  ?Pt to follow-up in 3 months or sooner if clinically indicated.  ?Patient advised to contact office with any questions, adverse effects, or acute worsening in signs and symptoms. ? ? ?Clifford was seen today for follow-up. ? ?Diagnoses and all orders for this visit: ? ?Attention deficit hyperactivity disorder (ADHD), combined type ?-     lisdexamfetamine (VYVANSE)  30 MG capsule; Take 1 capsule (30 mg total) by mouth daily. ?-     lisdexamfetamine (VYVANSE) 30 MG capsule; Take 1 capsule (30 mg total) by mouth daily. ?-     lisdexamfetamine (VYVANSE) 30 MG capsule; Take 1 capsule (30 mg total) by mouth daily. ? ?Depression, unspecified depression type ?-     buPROPion (WELLBUTRIN XL) 150 MG 24 hr tablet; Take 1 tablet (150 mg total) by mouth daily. ?-     sertraline (ZOLOFT) 50 MG tablet; Take 1 tablet (50 mg total) by mouth daily. ? ?Anxiety disorder, unspecified type ?-     sertraline (  ZOLOFT) 50 MG tablet; Take 1 tablet (50 mg total) by mouth daily. ? ?  ? ?Please see After Visit Summary for patient specific instructions. ? ?Future Appointments  ?Date Time Provider Department Center  ?06/05/2021 10:00 AM Stevphen Meuse, [facility] CP-CP None  ?06/19/2021  8:00 AM Stevphen Meuse, [facility] None  ?06/30/2021  8:00 AM Stevphen Meuse, [facility] CP-CP None  ?07/15/2021  8:00 AM Stevphen Meuse, [facility] CP-[facility] None  ?09/03/2021  8:30 AM Corie Chiquito, [facility] CP-CP None  ? ? ?No orders of the defined types were placed in this encounter. ? ? ?------------------------------- ?

## 2021-06-03 NOTE — Telephone Encounter (Signed)
Pt paid $250.00 for her VYVANSE 30 MG l. Will contact [facility] [phone] ?ID# [id number] ?[id number] ?PCN:[id number] ?[id number] ? ?

## 2021-06-05 ENCOUNTER — Ambulatory Visit (INDEPENDENT_AMBULATORY_CARE_PROVIDER_SITE_OTHER): Payer: No Typology Code available for payment source | Admitting: Psychiatry

## 2021-06-05 ENCOUNTER — Other Ambulatory Visit: Payer: Self-pay

## 2021-06-05 DIAGNOSIS — F4310 Post-traumatic stress disorder, unspecified: Secondary | ICD-10-CM
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
## 2021-06-19 ENCOUNTER — Ambulatory Visit (INDEPENDENT_AMBULATORY_CARE_PROVIDER_SITE_OTHER): Payer: No Typology Code available for payment source | Admitting: Psychiatry

## 2021-06-19 DIAGNOSIS — F4310 Post-traumatic stress disorder, unspecified: Secondary | ICD-10-CM | POA: Diagnosis not present
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
## 2021-06-30 ENCOUNTER — Ambulatory Visit (INDEPENDENT_AMBULATORY_CARE_PROVIDER_SITE_OTHER): Payer: 59 | Admitting: Psychiatry

## 2021-06-30 DIAGNOSIS — F4310 Post-traumatic stress disorder, unspecified: Secondary | ICD-10-CM

## 2021-07-01 NOTE — Progress Notes (Signed)
Patient did not show for appointment.   

## 2021-07-15 ENCOUNTER — Ambulatory Visit (INDEPENDENT_AMBULATORY_CARE_PROVIDER_SITE_OTHER): Payer: No Typology Code available for payment source | Admitting: Psychiatry

## 2021-07-15 DIAGNOSIS — F4310 Post-traumatic stress disorder, unspecified: Secondary | ICD-10-CM
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
## 2021-07-31 ENCOUNTER — Ambulatory Visit (INDEPENDENT_AMBULATORY_CARE_PROVIDER_SITE_OTHER): Payer: No Typology Code available for payment source | Admitting: Psychiatry

## 2021-07-31 DIAGNOSIS — F4310 Post-traumatic stress disorder, unspecified: Secondary | ICD-10-CM | POA: Diagnosis not present
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
## 2021-08-12 ENCOUNTER — Ambulatory Visit: Payer: 59 | Admitting: Psychiatry

## 2021-08-26 ENCOUNTER — Ambulatory Visit (INDEPENDENT_AMBULATORY_CARE_PROVIDER_SITE_OTHER): Payer: No Typology Code available for payment source | Admitting: Psychiatry

## 2021-08-26 DIAGNOSIS — F4310 Post-traumatic stress disorder, unspecified: Secondary | ICD-10-CM | POA: Diagnosis not present
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
## 2021-09-03 ENCOUNTER — Encounter: Payer: Self-pay | Admitting: Psychiatry

## 2021-09-03 ENCOUNTER — Ambulatory Visit (INDEPENDENT_AMBULATORY_CARE_PROVIDER_SITE_OTHER): Payer: No Typology Code available for payment source | Admitting: Psychiatry

## 2021-09-03 VITALS — BP 116/75 | HR 68

## 2021-09-03 DIAGNOSIS — F902 Attention-deficit hyperactivity disorder, combined type: Secondary | ICD-10-CM | POA: Diagnosis not present

## 2021-09-03 DIAGNOSIS — F32A Depression, unspecified: Secondary | ICD-10-CM | POA: Diagnosis not present

## 2021-09-03 DIAGNOSIS — F4310 Post-traumatic stress disorder, unspecified: Secondary | ICD-10-CM | POA: Diagnosis not present

## 2021-09-03 DIAGNOSIS — F419 Anxiety disorder, unspecified: Secondary | ICD-10-CM

## 2021-09-03 MED ORDER — LISDEXAMFETAMINE DIMESYLATE 30 MG PO CAPS: 30.0000 mg | ORAL_CAPSULE | Freq: Every day | ORAL | 0 refills | Status: DC

## 2021-09-03 MED ORDER — BUPROPION HCL ER (XL) 150 MG PO TB24: 150.0000 mg | ORAL_TABLET | Freq: Every day | ORAL | 1 refills | Status: DC

## 2021-09-03 MED ORDER — ALPRAZOLAM 0.25 MG PO TABS: ORAL_TABLET | ORAL | 0 refills | Status: DC

## 2021-09-03 MED ORDER — SERTRALINE HCL 50 MG PO TABS: 50.0000 mg | ORAL_TABLET | Freq: Every day | ORAL | 1 refills | Status: DC
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
## 2021-09-09 ENCOUNTER — Other Ambulatory Visit: Payer: Self-pay

## 2021-09-09 MED ORDER — ALPRAZOLAM 0.5 MG PO TABS: ORAL_TABLET | ORAL | 0 refills | Status: DC

## 2021-09-11 ENCOUNTER — Telehealth: Payer: Self-pay | Admitting: Psychiatry

## 2021-09-15 ENCOUNTER — Ambulatory Visit: Payer: No Typology Code available for payment source | Admitting: Psychiatry

## 2021-09-29 ENCOUNTER — Ambulatory Visit (INDEPENDENT_AMBULATORY_CARE_PROVIDER_SITE_OTHER): Payer: No Typology Code available for payment source | Admitting: Psychiatry

## 2021-09-29 DIAGNOSIS — F4310 Post-traumatic stress disorder, unspecified: Secondary | ICD-10-CM | POA: Diagnosis not present

## 2021-09-29 NOTE — Progress Notes (Signed)
[facility] Counselor/Therapist Progress Note  Patient ID: Peggy Sanchez, MRN: [id number],    Date: 09/29/2021  Time Spent: 59 minutes start time 9:05 AM end time 10:04 AM  Treatment Type: Individual Therapy  Reported Symptoms: anxiety, triggered responses, sadness  Mental Status Exam:  Appearance:   Casual     Behavior:  Appropriate  Motor:  Normal  Speech/Language:   Normal Rate  Affect:  Appropriate  Mood:  anxious  Thought process:  normal  Thought content:    WNL  Sensory/Perceptual disturbances:    WNL  Orientation:  oriented to person, place, time/date, and situation  Attention:  Good  Concentration:  Good  Memory:  WNL  Fund of knowledge:   Good  Insight:    Good  Judgment:   Good  Impulse Control:  Good   Risk Assessment: Danger to Self:  No Self-injurious Behavior: No Danger to Others: No Duty to Warn:no Physical Aggression / Violence:No  Access to Firearms a concern: No  Gang Involvement:No   Subjective: Patient was present for session.  She shared that there was [place] which made her feel that she was going to die.  Patient explained that that experience led to her realizing she did not want to be in her current relationship any longer.  Patient explained some of her concerns and feelings about the dynamic.  She discussed ways that she is trying to move forward.  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  Patient went on to explain that she has also started to communicate again with her ex-husband.  She shared that through everything she started realizing that she did want to figure out how to have her whole family and make things work with her ex.  Patient explained that they have started going to couples counseling and she has been spending a lot of time with he and their 3 children together.  Patient stated that her oldest son's behavior is some  better at this point.  Encouraged her to realize that there is still a safety concern that has to be addressed and the only way that things are going to work with her husband is if they are able to figure out how to be on the same page with their parenting.  Patient acknowledged she has been communicating with him about the importance of being a unified front.  She shared she has noticed he is making progress in that area.  Encouraged her to continue working on that and couples counseling and to recognize if they are not going to be that then the relationship will not be helpful for anyone.  Patient shared that the changes have created lots of discussion and concerns with others.  She was encouraged to focus on what she needs in the relationship and what she is wanting for her family and work on those issues regardless of what anybody else thinks at this point.  Was also encouraged to try and take things slow and to work on herself regardless of what happens with her husband during this time.  Interventions: Solution-Oriented/Positive Psychology and Insight-Oriented  Diagnosis:   ICD-10-CM   1. PTSD (post-traumatic stress disorder)  F43.10       Plan: Patient is to use CBT and coping skills to decrease triggered responses.  Patient is to continue to communicate safety concerns concerning [name] with her ex-husband and  see if they can develop a plan for safety within the family.  Patient is to work with ex in couples counseling to address coparenting issues.  Patient is to continue exercising to release negative emotions appropriately.  Patient is to  remind herself to focus on the things that she can control fix and change and work on journaling when she gets very triggered rather than responding in the moment.   Patient is to take medication as directed. Long-term goal: Recall the traumatic event without becoming overwhelming negative emotions Short-term goal: Identify the symptoms of PTSD that have  caused distress and impaired functioning    Stevphen Meuse, [facility]

## 2021-10-13 ENCOUNTER — Ambulatory Visit (INDEPENDENT_AMBULATORY_CARE_PROVIDER_SITE_OTHER): Payer: No Typology Code available for payment source | Admitting: Psychiatry

## 2021-10-13 DIAGNOSIS — F4310 Post-traumatic stress disorder, unspecified: Secondary | ICD-10-CM | POA: Diagnosis not present
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
## 2021-10-27 ENCOUNTER — Ambulatory Visit (INDEPENDENT_AMBULATORY_CARE_PROVIDER_SITE_OTHER): Payer: No Typology Code available for payment source | Admitting: Psychiatry

## 2021-10-27 DIAGNOSIS — F4310 Post-traumatic stress disorder, unspecified: Secondary | ICD-10-CM

## 2021-10-27 NOTE — Progress Notes (Signed)
      [facility] Counselor/Therapist Progress Note  Patient ID: Peggy Sanchez, MRN: [id number],    Date: 10/27/2021  Time Spent: 48 minutes start time 9:12 AM end time 10:00 AM  Treatment Type: Individual Therapy  Reported Symptoms: anxiety, sadness, triggered responses  Mental Status Exam:  Appearance:   Well Groomed     Behavior:  Appropriate  Motor:  Normal  Speech/Language:   Normal Rate  Affect:  Appropriate  Mood:  normal  Thought process:  normal  Thought content:    WNL  Sensory/Perceptual disturbances:    WNL  Orientation:  oriented to person, place, time/date, and situation  Attention:  Good  Concentration:  Good  Memory:  WNL  Fund of knowledge:   Good  Insight:    Good  Judgment:   Good  Impulse Control:  Good   Risk Assessment: Danger to Self:  No Self-injurious Behavior: No Danger to Others: No Duty to Warn:no Physical Aggression / Violence:No  Access to Firearms a concern: No  Gang Involvement:No   Subjective: Patient was present for session. She shared that she is back with her boyfriend and away from her ex. Her mother is struggling with her boyfriend after all that happened. She is realizing that she has over shared with her mother in the past and now it is creating issues.  Discussed the importance of focusing on what she needs for herself and to recognize it may just take her mother time to deal with the whole situation if she decides to reconcile with her boyfriend.  Patient was encouraged to realize she does have the boys to think about and they are already attached to him and his children and that may be a better option for her since she does love him.  She did share she is changing the way she does things with him and he is also improving on his negative qualities.  The 2 of them are also figuring out ways to have better boundaries and take things slow in their relationship.  Patient was encouraged to continue focusing on taking things 1 step at a time  and to recognize she does not need to do anything quickly at this point.  Interventions: Solution-Oriented/Positive Psychology and Insight-Oriented  Diagnosis:   ICD-10-CM   1. PTSD (post-traumatic stress disorder)  F43.10       Plan: Patient is to use CBT and coping skills to decrease triggered responses.  Patient is to continue to communicate safety concerns concerning [name] with her ex-husband and see if they can develop a plan for safety within the family.  Patient is to work with ex in couples counseling to address coparenting issues.  Patient is to continue exercising to release negative emotions appropriately.  Patient is to  remind herself to focus on the things that she can control fix and change and work on journaling when she gets very triggered rather than responding in the moment.   Patient is to take medication as directed. Long-term goal: Recall the traumatic event without becoming overwhelming negative emotions Short-term goal: Identify the symptoms of PTSD that have caused distress and impaired functioning  Stevphen Meuse, [facility]

## 2021-11-10 ENCOUNTER — Telehealth: Payer: Self-pay | Admitting: Psychiatry

## 2021-11-10 ENCOUNTER — Other Ambulatory Visit: Payer: Self-pay

## 2021-11-10 DIAGNOSIS — F902 Attention-deficit hyperactivity disorder, combined type: Secondary | ICD-10-CM

## 2021-11-10 NOTE — Telephone Encounter (Signed)
Pt called reporting last RF Vyvanse 30 mg on 7/31 was for 21 day supply. She has 2 left. Pharmacy asking for new Rx and change date on one sent 8/27 to reflect change. [facility].

## 2021-11-10 NOTE — Telephone Encounter (Signed)
Pended.

## 2021-11-11 ENCOUNTER — Ambulatory Visit: Payer: No Typology Code available for payment source | Admitting: Psychiatry

## 2021-11-11 MED ORDER — LISDEXAMFETAMINE DIMESYLATE 30 MG PO CAPS: 30.0000 mg | ORAL_CAPSULE | Freq: Every day | ORAL | 0 refills | Status: DC

## 2021-12-04 ENCOUNTER — Encounter: Payer: Self-pay | Admitting: Psychiatry

## 2021-12-04 ENCOUNTER — Ambulatory Visit (INDEPENDENT_AMBULATORY_CARE_PROVIDER_SITE_OTHER): Payer: No Typology Code available for payment source | Admitting: Psychiatry

## 2021-12-04 VITALS — BP 120/86 | HR 76

## 2021-12-04 DIAGNOSIS — F4310 Post-traumatic stress disorder, unspecified: Secondary | ICD-10-CM

## 2021-12-04 DIAGNOSIS — F902 Attention-deficit hyperactivity disorder, combined type: Secondary | ICD-10-CM | POA: Diagnosis not present

## 2021-12-04 DIAGNOSIS — F32A Depression, unspecified: Secondary | ICD-10-CM

## 2021-12-04 MED ORDER — LISDEXAMFETAMINE DIMESYLATE 30 MG PO CAPS: 30.0000 mg | ORAL_CAPSULE | Freq: Every day | ORAL | 0 refills | Status: DC

## 2021-12-04 MED ORDER — SERTRALINE HCL 50 MG PO TABS: 50.0000 mg | ORAL_TABLET | Freq: Every day | ORAL | 1 refills | Status: DC

## 2021-12-04 NOTE — Progress Notes (Signed)
Peggy Sanchez [id number] April 08, 1985 36 y.o.  Subjective:   Patient ID:  Peggy Sanchez is a 36 y.o. (DOB 10-Mar-1986) female.  Chief Complaint: No chief complaint on file.   HPI Lezette Kitts presents to the office today for follow-up of ADHD and anxiety.   She reports that her oldest son has had improved behaviors with a medication change and this has helped reduced her stress. She notices some irritation and frustration during times that are busier with children. Denies depressed mood. She feels that anxiety has been controlled. Appetite has been good. Denies SI.   She reports that for 5 days she did not take Vyvanse to determine if she would feel less irritable and did not notice a change.  She has not been having contact with her sister and this is a change since they used to be best friends. Mother has been depressed and has noticed a significant improvement after recently starting Wellbutrin XL   Vyvanse last filled 11/11/21.  Past Psychiatric Medication Trials: Sertraline- Helped initially for several years at 25 mg and then increased to 50 mg daily. Had affective dulling and fatigue with 75 mg.  Prozac- Helped initially. Was on 20 mg and increased to 30 mg at the start of the pandemic. Dose was increased to 40 mg and now feels "exhausted" and wants to sleep. No longer seems to be as helpful. Has been more tired on 40 mg with some improvement.  Wellbutrin XL Klonopin Adderall XR- took 20 mg in college and at one point was on 20 mg BID Had some anxiety. Vyvanse       Review of Systems:  Review of Systems  Cardiovascular:  Negative for palpitations.  Musculoskeletal:  Negative for gait problem.  Neurological:  Negative for tremors and headaches.  Psychiatric/Behavioral:         Please refer to HPI    Medications: I have reviewed the patient's current medications.  Current Outpatient Medications  Medication Sig Dispense Refill  . ALPRAZolam (XANAX) 0.5 MG tablet Take  1/2 tab po qd prn panic and severe anxiety 8 tablet 0  . buPROPion (WELLBUTRIN XL) 150 MG 24 hr tablet Take 1 tablet (150 mg total) by mouth daily. 90 tablet 1  . lisdexamfetamine (VYVANSE) 30 MG capsule Take 1 capsule (30 mg total) by mouth daily. 30 capsule 0  . lisdexamfetamine (VYVANSE) 30 MG capsule Take 1 capsule (30 mg total) by mouth daily. 30 capsule 0  . lisdexamfetamine (VYVANSE) 30 MG capsule Take 1 capsule (30 mg total) by mouth daily. 30 capsule 0  . Misc. Devices (ALL-BODY MASSAGE) MISC Full body and paracervical massage 3 times weekly, medically necessary. 1 each 0  . [name] 3-0.02 MG tablet Take 1 tablet by mouth daily.    . sertraline (ZOLOFT) 50 MG tablet Take 1 tablet (50 mg total) by mouth daily. 90 tablet 1  . triamcinolone cream (KENALOG) 0.5 % Apply 1 application topically 2 (two) times daily. To affected areas. (Patient not taking: Reported on 11/22/2020) 30 g 3   No current facility-administered medications for this visit.    Medication Side Effects: None  Allergies: No Known Allergies  Past Medical History:  Diagnosis Date  . Dental crowns present   . Difficult intravenous access   . Indication for care in labor or delivery 06/25/2015  . Interstitial cystitis   . Medial meniscus tear 04/2014   left knee  . Postpartum care following vaginal delivery (4/4) 06/25/2015  . Pregnancy with third trimester bleeding,  antepartum 06/25/2015    Past Medical History, Surgical history, Social history, and Family history were reviewed and updated as appropriate.   Please see review of systems for further details on the patient's review from today.   Objective:   Physical Exam:  There were no vitals taken for this visit.  Physical Exam  Lab Review:     Component Value Date/Time   NA 136 06/25/2015 0805   K 3.5 06/25/2015 0805   CL 107 06/25/2015 0805   CO2 25 06/25/2015 0805   GLUCOSE 76 06/25/2015 0805   BUN 8 06/25/2015 0805   CREATININE 0.64 06/25/2015 0805    CALCIUM 8.5 (L) 06/25/2015 0805   PROT 6.3 (L) 06/25/2015 0805   ALBUMIN 2.8 (L) 06/25/2015 0805   AST 18 06/25/2015 0805   ALT 10 (L) 06/25/2015 0805   ALKPHOS 155 (H) 06/25/2015 0805   BILITOT 0.4 06/25/2015 0805   GFRNONAA >60 06/25/2015 0805   GFRAA >60 06/25/2015 0805       Component Value Date/Time   WBC 11.6 (H) 06/26/2015 0502   RBC 3.59 (L) 06/26/2015 0502   HGB 11.1 (L) 06/26/2015 0502   HCT 32.0 (L) 06/26/2015 0502   PLT 223 06/26/2015 0502   MCV 89.1 06/26/2015 0502   MCH 30.9 06/26/2015 0502   MCHC 34.7 06/26/2015 0502   RDW 13.1 06/26/2015 0502    No results found for: "POCLITH", "LITHIUM"   No results found for: "PHENYTOIN", "PHENOBARB", "VALPROATE", "CBMZ"   .res Assessment: Plan:    There are no diagnoses linked to this encounter.   Please see After Visit Summary for patient specific instructions.  Future Appointments  Date Time Provider Department Center  12/22/2021  9:00 AM Stevphen Meuse, [facility] None  01/06/2022 10:00 AM Stevphen Meuse, [facility] None  01/20/2022 10:00 AM Stevphen Meuse, [facility] None    No orders of the defined types were placed in this encounter.   -------------------------------

## 2021-12-22 ENCOUNTER — Ambulatory Visit (INDEPENDENT_AMBULATORY_CARE_PROVIDER_SITE_OTHER): Payer: No Typology Code available for payment source | Admitting: Psychiatry

## 2021-12-22 DIAGNOSIS — F4310 Post-traumatic stress disorder, unspecified: Secondary | ICD-10-CM
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
## 2022-01-06 ENCOUNTER — Ambulatory Visit (INDEPENDENT_AMBULATORY_CARE_PROVIDER_SITE_OTHER): Payer: No Typology Code available for payment source | Admitting: Psychiatry

## 2022-01-06 DIAGNOSIS — F4310 Post-traumatic stress disorder, unspecified: Secondary | ICD-10-CM
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
## 2022-01-11 ENCOUNTER — Other Ambulatory Visit: Payer: Self-pay | Admitting: Psychiatry

## 2022-01-11 DIAGNOSIS — F32A Depression, unspecified: Secondary | ICD-10-CM

## 2022-01-20 ENCOUNTER — Ambulatory Visit: Payer: No Typology Code available for payment source | Admitting: Psychiatry

## 2022-02-11 ENCOUNTER — Ambulatory Visit (INDEPENDENT_AMBULATORY_CARE_PROVIDER_SITE_OTHER): Payer: No Typology Code available for payment source | Admitting: Psychiatry

## 2022-02-11 DIAGNOSIS — F4310 Post-traumatic stress disorder, unspecified: Secondary | ICD-10-CM

## 2022-02-11 NOTE — Progress Notes (Signed)
      [facility] Counselor/Therapist Progress Note  Patient ID: Peggy Sanchez, MRN: [id number],    Date: 02/11/2022  Time Spent: 52 minutes start time 9:08 AM end time 10:00 AM  Treatment Type: Individual Therapy  Reported Symptoms: anxiety, triggered responses,focusing issues  Mental Status Exam:  Appearance:   Well Groomed     Behavior:  Appropriate  Motor:  Normal  Speech/Language:   Normal Rate  Affect:  Appropriate  Mood:  normal  Thought process:  normal  Thought content:    WNL  Sensory/Perceptual disturbances:    WNL  Orientation:  oriented to person, place, time/date, and situation  Attention:  Good  Concentration:  Good  Memory:  WNL  Fund of knowledge:   Good  Insight:    Good  Judgment:   Good  Impulse Control:  Good   Risk Assessment: Danger to Self:  No Self-injurious Behavior: No Danger to Others: No Duty to Warn:no Physical Aggression / Violence:No  Access to Firearms a concern: No  Gang Involvement:No   Subjective: Patient was present for session.  She shared she has someone working with her son who is helping her feel more control with her children and that has been great for her.  She reported that she has had issues with her mother.  Did processing set on mom's comments, SUDS level 9, negative cognition "I'm not good enough" felt helpless in her shoulders.  She was able to reduce SUDS level to 5 and she was able to recognize that since her medical issue she has been more direct with everyone and it is not something she needs to take personally.   Interventions: Solution-Oriented/Positive Psychology, Eye Movement Desensitization and Reprocessing (EMDR), and Insight-Oriented  Diagnosis:   ICD-10-CM   1. PTSD (post-traumatic stress disorder)  F43.10       Plan:  Patient is to use CBT and coping skills to decrease triggered responses. Patient is to remind herself of the truth and figure out ways to simplify her life situations.  Patient is to  continue exercising to release negative emotions appropriately.  Patient is to  remind herself to focus on the things that she can control fix and change and work on journaling when she gets very triggered rather than responding in the moment.   Patient is to take medication as directed. Long-term goal: Recall the traumatic event without becoming overwhelming negative emotions Short-term goal: Identify the symptoms of PTSD that have caused distress and impaired functioning  Stevphen Meuse, [facility]

## 2022-03-05 ENCOUNTER — Encounter: Payer: Self-pay | Admitting: Psychiatry

## 2022-03-05 ENCOUNTER — Ambulatory Visit (INDEPENDENT_AMBULATORY_CARE_PROVIDER_SITE_OTHER): Payer: No Typology Code available for payment source | Admitting: Psychiatry

## 2022-03-05 DIAGNOSIS — F4310 Post-traumatic stress disorder, unspecified: Secondary | ICD-10-CM | POA: Diagnosis not present

## 2022-03-05 DIAGNOSIS — F902 Attention-deficit hyperactivity disorder, combined type: Secondary | ICD-10-CM

## 2022-03-05 DIAGNOSIS — F32A Depression, unspecified: Secondary | ICD-10-CM | POA: Diagnosis not present

## 2022-03-05 MED ORDER — BUPROPION HCL ER (XL) 150 MG PO TB24: 150.0000 mg | ORAL_TABLET | Freq: Every day | ORAL | 1 refills | Status: DC

## 2022-03-05 MED ORDER — SERTRALINE HCL 50 MG PO TABS: 50.0000 mg | ORAL_TABLET | Freq: Every day | ORAL | 1 refills | Status: DC

## 2022-03-05 MED ORDER — LISDEXAMFETAMINE DIMESYLATE 30 MG PO CAPS: 30.0000 mg | ORAL_CAPSULE | Freq: Every day | ORAL | 0 refills | Status: DC

## 2022-03-05 NOTE — Progress Notes (Signed)
Peggy Sanchez [id number] [birth year] 36 y.o.  Subjective:   Patient ID:  Peggy Sanchez is a 36 y.o. (DOB 1985/07/02) female.  Chief Complaint:  Chief Complaint  Patient presents with   Follow-up    Anxiety, ADHD    HPI Peggy Sanchez presents to the office today for follow-up of anxiety and ADHD. She reports that she has been feeling "exhausted" today. She reports that overall her energy and motivation have been ok. Anxiety has been manageable. She notices some mild irritability and "emotional" a few days prior to her menstrual cycle. Sleeping well. Denies appetite disturbance. Concentration improved with Vyvanse. Denies SI.   Oldest son is receiving in home services.   Vyvanse last filled 02/14/22.   Past Psychiatric Medication Trials: Sertraline- Helped initially for several years at 25 mg and then increased to 50 mg daily. Had affective dulling and fatigue with 75 mg.  Prozac- Helped initially. Was on 20 mg and increased to 30 mg at the start of the pandemic. Dose was increased to 40 mg and now feels "exhausted" and wants to sleep. No longer seems to be as helpful. Has been more tired on 40 mg with some improvement.  Wellbutrin XL Klonopin Adderall XR- took 20 mg in college and at one point was on 20 mg BID Had some anxiety. Vyvanse  Review of Systems:  Review of Systems  Cardiovascular:  Negative for palpitations.  Musculoskeletal:  Negative for gait problem.  Neurological:  Negative for tremors.  Psychiatric/Behavioral:         Please refer to HPI    Medications: I have reviewed the patient's current medications.  Current Outpatient Medications  Medication Sig Dispense Refill   [name] 3-0.02 MG tablet Take 1 tablet by mouth daily.     ALPRAZolam (XANAX) 0.5 MG tablet Take 1/2 tab po qd prn panic and severe anxiety (Patient not taking: Reported on 12/04/2021) 8 tablet 0   buPROPion (WELLBUTRIN XL) 150 MG 24 hr tablet Take 1 tablet (150 mg total) by mouth daily. 90  tablet 1   lisdexamfetamine (VYVANSE) 30 MG capsule Take 1 capsule (30 mg total) by mouth daily. 30 capsule 0   lisdexamfetamine (VYVANSE) 30 MG capsule Take 1 capsule (30 mg total) by mouth daily. 30 capsule 0   [START ON 03/14/2022] lisdexamfetamine (VYVANSE) 30 MG capsule Take 1 capsule (30 mg total) by mouth daily. 90 capsule 0   Misc. Devices (ALL-BODY MASSAGE) MISC Full body and paracervical massage 3 times weekly, medically necessary. 1 each 0   sertraline (ZOLOFT) 50 MG tablet Take 1 tablet (50 mg total) by mouth daily. 90 tablet 1   triamcinolone cream (KENALOG) 0.5 % Apply 1 application topically 2 (two) times daily. To affected areas. (Patient not taking: Reported on 11/22/2020) 30 g 3   No current facility-administered medications for this visit.    Medication Side Effects: None  Allergies: No Known Allergies  Past Medical History:  Diagnosis Date   Dental crowns present    Difficult intravenous access    Indication for care in labor or delivery 06/25/2015   Interstitial cystitis    Medial meniscus tear 04/2014   left knee   Postpartum care following vaginal delivery (4/4) 06/25/2015   Pregnancy with third trimester bleeding, antepartum 06/25/2015    Past Medical History, Surgical history, Social history, and Family history were reviewed and updated as appropriate.   Please see review of systems for further details on the patient's review from today.   Objective:   Physical  Exam:  BP 120/82   Pulse 70   Physical Exam Constitutional:      General: She is not in acute distress. Musculoskeletal:        General: No deformity.  Neurological:     Mental Status: She is alert and oriented to person, place, and time.     Coordination: Coordination normal.  Psychiatric:        Attention and Perception: Attention and perception normal. She does not perceive auditory or visual hallucinations.        Mood and Affect: Mood normal. Mood is not anxious or depressed. Affect is not  labile, blunt, angry or inappropriate.        Speech: Speech normal.        Behavior: Behavior normal.        Thought Content: Thought content normal. Thought content is not paranoid or delusional. Thought content does not include homicidal or suicidal ideation. Thought content does not include homicidal or suicidal plan.        Cognition and Memory: Cognition and memory normal.        Judgment: Judgment normal.     Comments: Insight intact     Lab Review:     Component Value Date/Time   NA 136 06/25/2015 0805   K 3.5 06/25/2015 0805   CL 107 06/25/2015 0805   CO2 25 06/25/2015 0805   GLUCOSE 76 06/25/2015 0805   BUN 8 06/25/2015 0805   CREATININE 0.64 06/25/2015 0805   CALCIUM 8.5 (L) 06/25/2015 0805   PROT 6.3 (L) 06/25/2015 0805   ALBUMIN 2.8 (L) 06/25/2015 0805   AST 18 06/25/2015 0805   ALT 10 (L) 06/25/2015 0805   ALKPHOS 155 (H) 06/25/2015 0805   BILITOT 0.4 06/25/2015 0805   GFRNONAA >60 06/25/2015 0805   GFRAA >60 06/25/2015 0805       Component Value Date/Time   WBC 11.6 (H) 06/26/2015 0502   RBC 3.59 (L) 06/26/2015 0502   HGB 11.1 (L) 06/26/2015 0502   HCT 32.0 (L) 06/26/2015 0502   PLT 223 06/26/2015 0502   MCV 89.1 06/26/2015 0502   MCH 30.9 06/26/2015 0502   MCHC 34.7 06/26/2015 0502   RDW 13.1 06/26/2015 0502    No results found for: "POCLITH", "LITHIUM"   No results found for: "PHENYTOIN", "PHENOBARB", "VALPROATE", "CBMZ"   .res Assessment: Plan:   Will continue current plan of care since target signs and symptoms are well controlled without any tolerability issues. Will continue Vyvanse 30 mg po qd for ADHD. Continue Wellbutrin XL 150 mg daily for depression.  Continue Sertraline 50 mg daily for anxiety and depression.  Recommend continuing therapy with Stevphen Meuse, [facility].  Pt to follow-up in 3 months or sooner if clinically indicated.  Patient advised to contact office with any questions, adverse effects, or acute worsening in signs and  symptoms.   Peggy Sanchez was seen today for follow-up.  Diagnoses and all orders for this visit:  Depression, unspecified depression type -     buPROPion (WELLBUTRIN XL) 150 MG 24 hr tablet; Take 1 tablet (150 mg total) by mouth daily. -     sertraline (ZOLOFT) 50 MG tablet; Take 1 tablet (50 mg total) by mouth daily.  Attention deficit hyperactivity disorder (ADHD), combined type -     lisdexamfetamine (VYVANSE) 30 MG capsule; Take 1 capsule (30 mg total) by mouth daily.  PTSD (post-traumatic stress disorder) -     sertraline (ZOLOFT) 50 MG tablet; Take 1 tablet (50 mg  total) by mouth daily.     Please see After Visit Summary for patient specific instructions.  Future Appointments  Date Time Provider Department Center  04/14/2022  8:00 AM Stevphen Meuse, [facility] None  04/28/2022  9:00 AM Stevphen Meuse, [facility] None  05/12/2022  9:00 AM Stevphen Meuse, [facility] None  05/25/2022  9:00 AM Stevphen Meuse, [facility] CP-CP None  06/04/2022  8:30 AM Corie Chiquito, [facility] CP-CP None    No orders of the defined types were placed in this encounter.   -------------------------------

## 2022-03-12 IMAGING — DX DG KNEE COMPLETE 4+V*L*
4 series · 4 of 4 positions shown · non-contrast
Comparison: None.

CLINICAL DATA: Left knee pain since an injury moving a heavy piece
of [employer] few days ago. Initial encounter.

EXAM:
LEFT KNEE - COMPLETE 4+ VIEW

[knee ap]
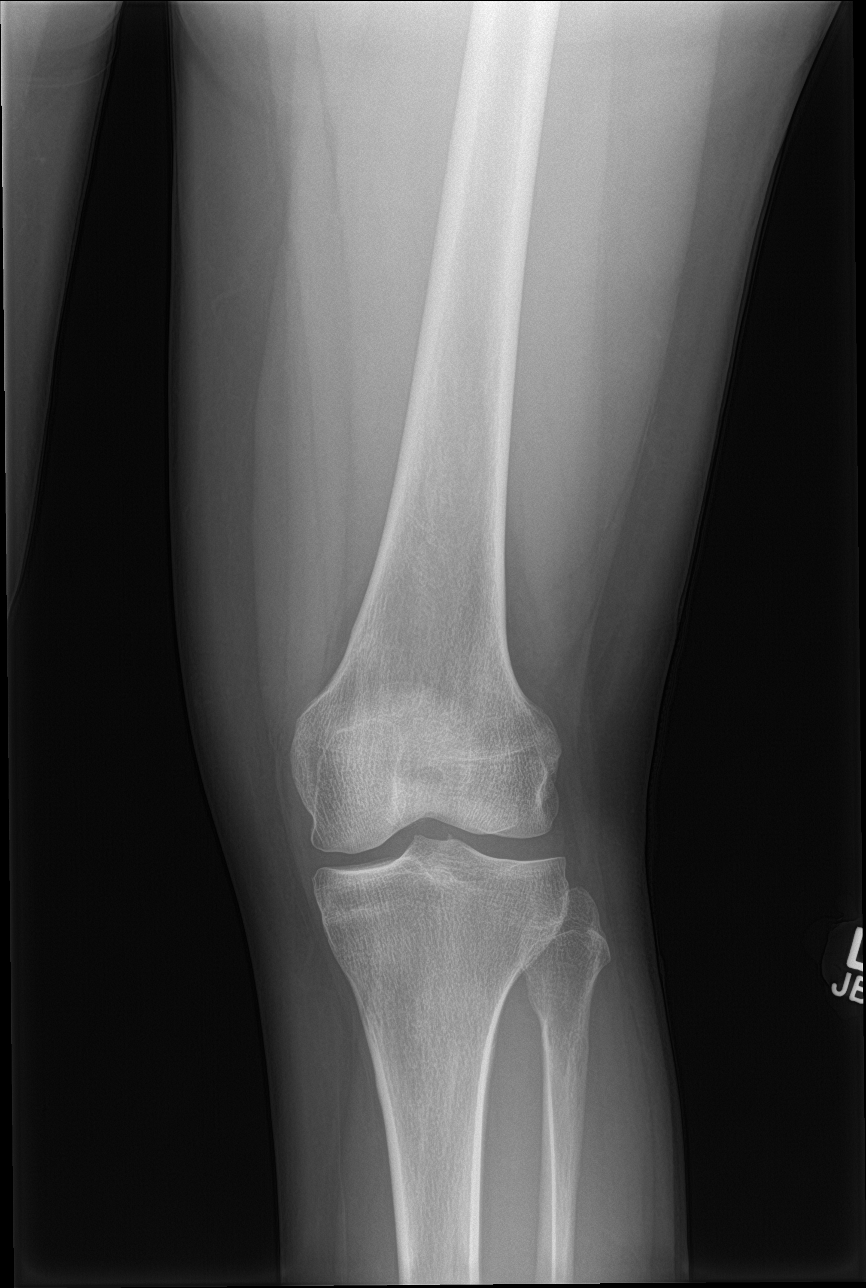

[knee lat]
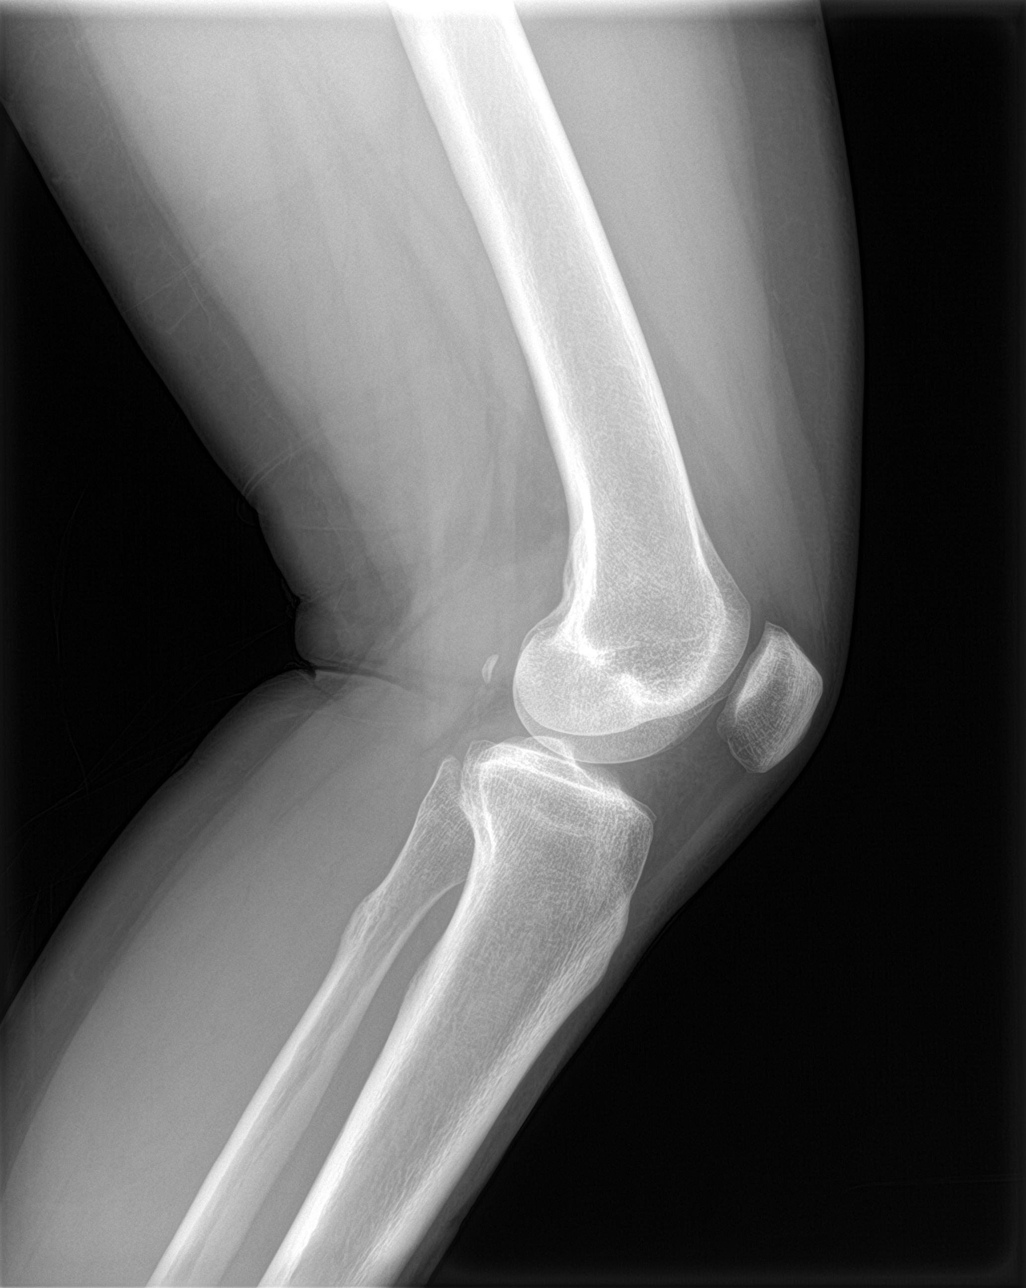

[knee obl (1 of 2)]
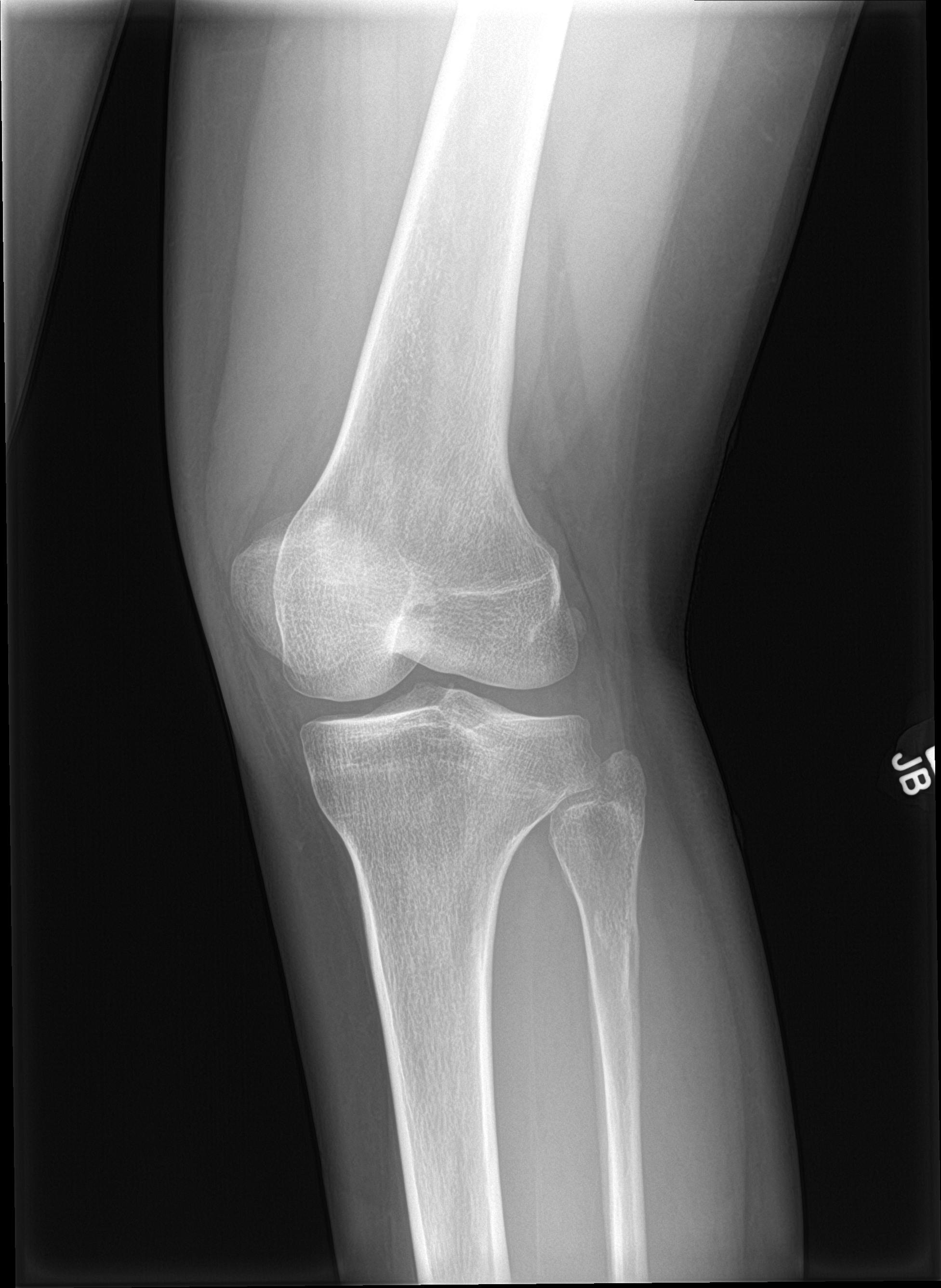

[knee obl (2 of 2)]
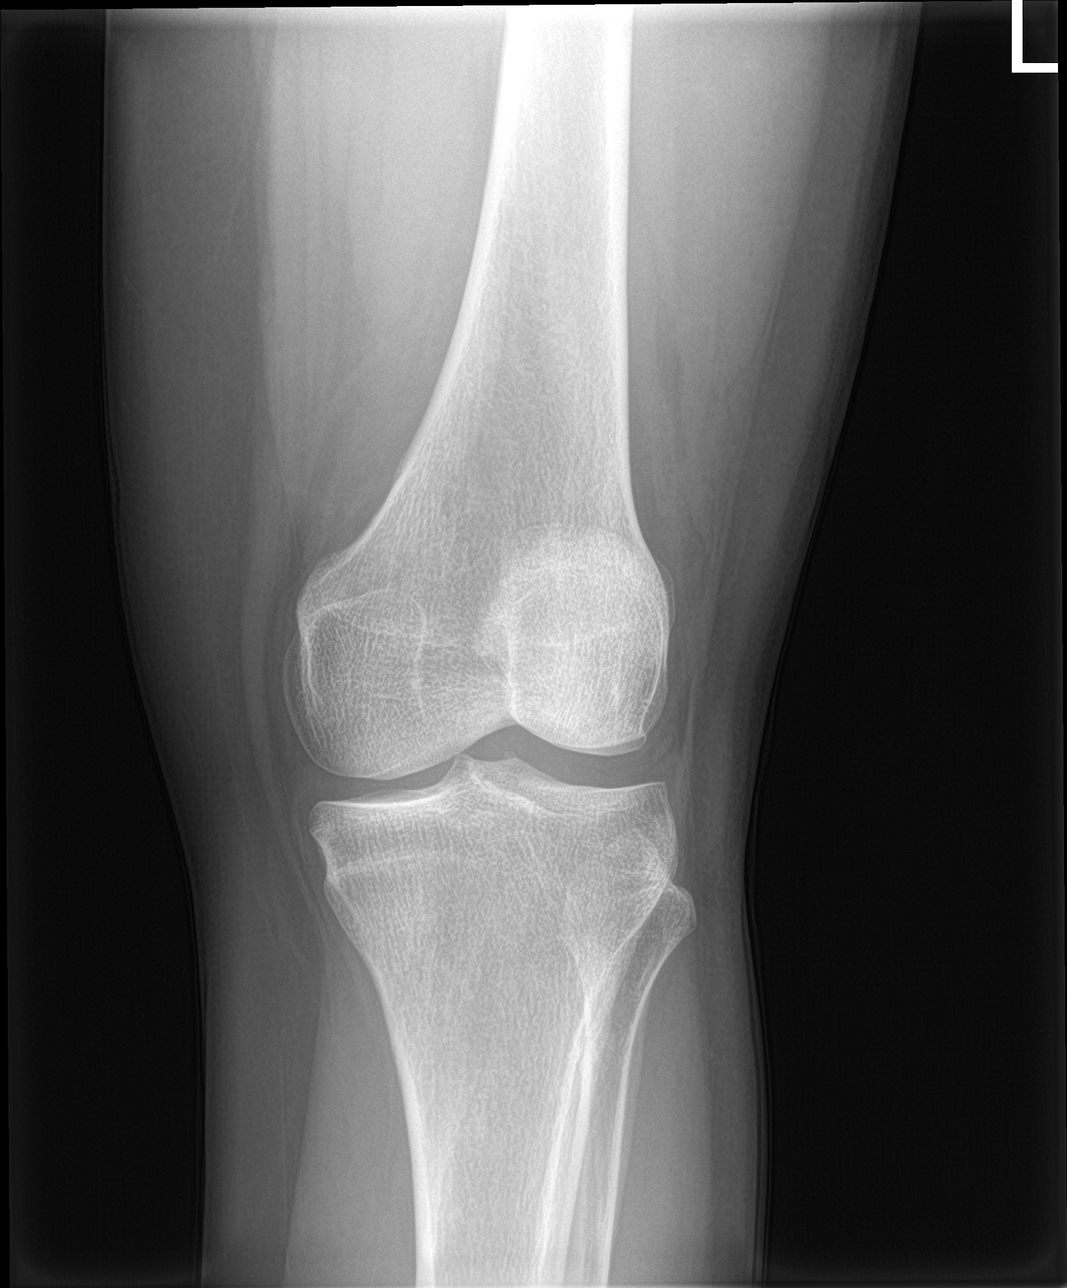

[4 of 4 positions shown; findings below may reference images not displayed]

FINDINGS: No acute bony abnormality is identified. Small joint effusion is
noted. No focal bony lesion or chondrocalcinosis. Joint spaces are
preserved.
IMPRESSION: Small joint effusion.  Otherwise negative.

## 2022-03-31 ENCOUNTER — Telehealth: Payer: Self-pay | Admitting: Psychiatry

## 2022-03-31 ENCOUNTER — Other Ambulatory Visit: Payer: Self-pay

## 2022-03-31 DIAGNOSIS — F902 Attention-deficit hyperactivity disorder, combined type: Secondary | ICD-10-CM

## 2022-03-31 MED ORDER — LISDEXAMFETAMINE DIMESYLATE 30 MG PO CAPS: 30.0000 mg | ORAL_CAPSULE | Freq: Every day | ORAL | 0 refills | Status: DC

## 2022-03-31 NOTE — Telephone Encounter (Signed)
Pt reporting only received #20 of a #30 Rx for Vyvanse. Please send new Rx to [facility]. Apt 3/14

## 2022-03-31 NOTE — Telephone Encounter (Signed)
Pended.

## 2022-04-01 ENCOUNTER — Other Ambulatory Visit: Payer: Self-pay

## 2022-04-01 ENCOUNTER — Telehealth: Payer: Self-pay | Admitting: Psychiatry

## 2022-04-01 DIAGNOSIS — F902 Attention-deficit hyperactivity disorder, combined type: Secondary | ICD-10-CM

## 2022-04-01 MED ORDER — LISDEXAMFETAMINE DIMESYLATE 30 MG PO CAPS: 30.0000 mg | ORAL_CAPSULE | Freq: Every day | ORAL | 0 refills | Status: DC

## 2022-04-01 NOTE — Telephone Encounter (Signed)
[facility]'t [facility]. Please send to [facility]. In stock

## 2022-04-01 NOTE — Telephone Encounter (Signed)
[facility]. Pt requesting to sent [facility] In stock

## 2022-04-01 NOTE — Telephone Encounter (Signed)
Pended.

## 2022-04-14 ENCOUNTER — Ambulatory Visit (INDEPENDENT_AMBULATORY_CARE_PROVIDER_SITE_OTHER): Payer: No Typology Code available for payment source | Admitting: Psychiatry

## 2022-04-14 DIAGNOSIS — F4310 Post-traumatic stress disorder, unspecified: Secondary | ICD-10-CM

## 2022-04-14 NOTE — Progress Notes (Signed)
      [facility] Counselor/Therapist Progress Note  Patient ID: Peggy Sanchez, MRN: [id number],    Date: 04/14/2022  Time Spent: 50 minutes start time 8:09  end time 8:59 AM  Treatment Type: Individual Therapy  Reported Symptoms: anxiety, triggered responses, rumination, sadness  Mental Status Exam:  Appearance:   Casual and Neat     Behavior:  Appropriate  Motor:  Normal  Speech/Language:   Normal Rate  Affect:  Appropriate  Mood:  anxious  Thought process:  normal  Thought content:    WNL  Sensory/Perceptual disturbances:    WNL  Orientation:  oriented to person, place, time/date, and situation  Attention:  Good  Concentration:  Good  Memory:  WNL  Fund of knowledge:   Good  Insight:    Good  Judgment:   Good  Impulse Control:  Good   Risk Assessment: Danger to Self:  No Self-injurious Behavior: No Danger to Others: No Duty to Warn:no Physical Aggression / Violence:No  Access to Firearms a concern: No  Gang Involvement:No   Subjective: Patient was present for session. She shared that she has moved in with her boyfriend and that is going well. She also feel and hurt her knee. Going to doctor after session. Things are still moving forward with her divorce that has been stressful but it is at least in progress.  Patient stated that the biggest issue currently is with her sister.  She shared her sister has a baby girl and she is not allowed to have any contact.  She explained that her sister and mother are currently going to counseling and much of the counseling for mother's report is concerning patient.  Patient stated that the whole situation has been very disturbing to her and she is not sure if she even wants to deal with the relationship with her sister at this point.  Patient was encouraged to take things 1 step at a time and to recognize that she does not need to go into a counseling session until some things have been processed with her concerning her sister.  She was  also encouraged to remind herself that all she can focus on is her own actions and not those of others.  Gave patient a handout on the things within her control and the things without her control reviewed it with patient..   Interventions: Cognitive Behavioral Therapy and Solution-Oriented/Positive Psychology  Diagnosis:   ICD-10-CM   1. PTSD (post-traumatic stress disorder)  F43.10       Plan: Patient is to use CBT and coping skills to decrease triggered responses.  Patient is to practice handout from session and focused on the things that are in her control.  Patient is to remind herself of the truth and figure out ways to simplify her life situations.  Patient is to continue exercising to release negative emotions appropriately.  Patient is to  remind herself to focus on the things that she can control fix and change and work on journaling when she gets very triggered rather than responding in the moment.   Patient is to take medication as directed. Long-term goal: Recall the traumatic event without becoming overwhelming negative emotions Short-term goal: Identify the symptoms of PTSD that have caused distress and impaired functioning  Lina Sayre, [facility]

## 2022-04-28 ENCOUNTER — Ambulatory Visit: Payer: No Typology Code available for payment source | Admitting: Psychiatry

## 2022-05-05 ENCOUNTER — Other Ambulatory Visit: Payer: Self-pay

## 2022-05-05 ENCOUNTER — Telehealth: Payer: Self-pay | Admitting: Psychiatry

## 2022-05-05 MED ORDER — LISDEXAMFETAMINE DIMESYLATE 40 MG PO CAPS: 40.0000 mg | ORAL_CAPSULE | ORAL | 0 refills | Status: DC

## 2022-05-05 NOTE — Telephone Encounter (Signed)
Pended.

## 2022-05-05 NOTE — Telephone Encounter (Signed)
Pt called and said that the [facility] has vyvanse 40 mg in stock. Please send script for the 40 mg.

## 2022-05-12 ENCOUNTER — Ambulatory Visit: Payer: No Typology Code available for payment source | Admitting: Psychiatry

## 2022-05-25 ENCOUNTER — Ambulatory Visit (INDEPENDENT_AMBULATORY_CARE_PROVIDER_SITE_OTHER): Payer: No Typology Code available for payment source | Admitting: Psychiatry

## 2022-05-25 ENCOUNTER — Telehealth: Payer: Self-pay | Admitting: Psychiatry

## 2022-05-25 DIAGNOSIS — F4310 Post-traumatic stress disorder, unspecified: Secondary | ICD-10-CM

## 2022-05-25 NOTE — Telephone Encounter (Signed)
[name], bertling are scheduled for a virtual visit with your provider today.    Just as we do with appointments in the office, we must obtain your consent to participate.  Your consent will be active for this visit and any virtual visit you may have with one of our providers in the next 365 days.    If you have a MyChart account, I can also send a copy of this consent to you electronically.  All virtual visits are billed to your insurance company just like a traditional visit in the office.  As this is a virtual visit, video technology does not allow for your provider to perform a traditional examination.  This may limit your provider's ability to fully assess your condition.  If your provider identifies any concerns that need to be evaluated in person or the need to arrange testing such as labs, EKG, etc, we will make arrangements to do so.    Although advances in technology are sophisticated, we cannot ensure that it will always work on either your end or our end.  If the connection with a video visit is poor, we may have to switch to a telephone visit.  With either a video or telephone visit, we are not always able to ensure that we have a secure connection.   I need to obtain your verbal consent now.   Are you willing to proceed with your visit today?   Peggy Sanchez has provided verbal consent on 05/25/2022 for a virtual visit (video or telephone).   Peggy Sanchez, [facility] 05/25/2022  9:07 AM

## 2022-05-25 NOTE — Progress Notes (Signed)
[facility] Counselor/Therapist Progress Note  Patient ID: Peggy Sanchez, MRN: [id number],    Date: 05/25/2022  Time Spent: 54 minutes start time 9:04 AM end time 9:58 AM Virtual Visit via video note Connected with patient by a telemedicine/telehealth application, with their informed consent, and verified patient privacy and that I am speaking with the correct person using two identifiers. I discussed the limitations, risks, security and privacy concerns of performing psychotherapy and the availability of in person appointments. I also discussed with the patient that there may be a patient responsible charge related to this service. The patient expressed understanding and agreed to proceed. I discussed the treatment planning with the patient. The patient was provided an opportunity to ask questions and all were answered. The patient agreed with the plan and demonstrated an understanding of the instructions. The patient was advised to call  our office if  symptoms worsen or feel they are in a crisis state and need immediate contact.   Therapist Location: office Patient Location: home    Treatment Type: Individual Therapy  Reported Symptoms: anxiety, triggered responses, agitation, medical issues  Mental Status Exam:  Appearance:   Casual     Behavior:  Appropriate  Motor:  Normal  Speech/Language:   Normal Rate  Affect:  Appropriate  Mood:  normal  Thought process:  normal  Thought content:    WNL  Sensory/Perceptual disturbances:    WNL  Orientation:  oriented to person, place, time/date, and situation  Attention:  Good  Concentration:  Good  Memory:  WNL  Fund of knowledge:   Good  Insight:    Good  Judgment:   Good  Impulse Control:  Good   Risk Assessment: Danger to Self:  No Self-injurious Behavior: No Danger to Others: No Duty to Warn:no Physical Aggression / Violence:No  Access to Firearms a concern: No  Gang Involvement:No   Subjective: Met with patient via  virtual visit due to not being able to drive due to recent knee surgery. She shared that it has been very difficult to deal with not being able to work and do what she needs to do.  She went on to explain that being still is very difficult for her and she is used to having things in her home a certain way.  She shared that her mother did have a counseling session  with her sister and it seemed to let her sister know that they were ready to move forward. Patient shared that has helped her to move on as well.  Patient was encouraged to feel good about the progress that is happening with her mother and how it impacts her positively.  Patient stated that things she really needed to address was a way that she got triggered by her boyfriend's son over the past week.  Patient was allowed time to discuss some of the dynamics within her household.  She was encouraged to recognize that the situation with her not being able to do what she needs to do is changing the dynamic for all of the children as well and since her boyfriend's children are only there every other week it is creating more stress for everyone including patient.  Patient was encouraged to talk with the boys about the situation and help them see the situation is the problem rather than she is and for her to remember that they are that it is the whole situation and dynamic.  From that conversation they are to develop a  plan that works for each of them.  Patient was able to recognize that her boyfriend's son is the oldest and she is having to ask him to do more than he is used to doing and that has to be hard for him.  Encouraged patient to acknowledge that with him and then to discuss how he can be compensated or rewarded by showing more responsibility he can also have more privileges.  Patient was encouraged to communicate what was discussed in session with her boyfriend so that they can work on having this conversation with the boys together and can think  through some things that they can do with the older kids who are having more responsibility than usual to help them feel better about the situation as well.   Interventions: Solution-Oriented/Positive Psychology and Insight-Oriented  Diagnosis:   ICD-10-CM   1. PTSD (post-traumatic stress disorder)  F43.10       Plan: Patient is to use CBT and coping skills to decrease triggered responses.  Patient is to follow plans from session regarding the situation at home with with her boyfriend's children and her own children.  Patient is to remind herself of the truth and figure out ways to simplify her life situations.  Patient is to continue exercising to release negative emotions appropriately.  Patient is to  remind herself to focus on the things that she can control fix and change and work on journaling when she gets very triggered rather than responding in the moment.   Patient is to take medication as directed. Long-term goal: Recall the traumatic event without becoming overwhelming negative emotions Short-term goal: Identify the symptoms of PTSD that have caused distress and impaired functioning    Lina Sayre, [facility]

## 2022-06-04 ENCOUNTER — Encounter: Payer: Self-pay | Admitting: Psychiatry

## 2022-06-04 ENCOUNTER — Ambulatory Visit (INDEPENDENT_AMBULATORY_CARE_PROVIDER_SITE_OTHER): Payer: No Typology Code available for payment source | Admitting: Psychiatry

## 2022-06-04 DIAGNOSIS — F32A Depression, unspecified: Secondary | ICD-10-CM

## 2022-06-04 DIAGNOSIS — F4310 Post-traumatic stress disorder, unspecified: Secondary | ICD-10-CM | POA: Diagnosis not present

## 2022-06-04 DIAGNOSIS — F902 Attention-deficit hyperactivity disorder, combined type: Secondary | ICD-10-CM

## 2022-06-04 MED ORDER — LISDEXAMFETAMINE DIMESYLATE 30 MG PO CAPS: 30.0000 mg | ORAL_CAPSULE | Freq: Every day | ORAL | 0 refills | Status: DC

## 2022-06-04 MED ORDER — BUPROPION HCL ER (XL) 150 MG PO TB24: 150.0000 mg | ORAL_TABLET | Freq: Every day | ORAL | 1 refills | Status: DC

## 2022-06-04 MED ORDER — SERTRALINE HCL 50 MG PO TABS: 50.0000 mg | ORAL_TABLET | Freq: Every day | ORAL | 1 refills | Status: DC

## 2022-06-04 NOTE — Progress Notes (Signed)
Peggy Sanchez [id number] [birth year] 37 y.o.  Subjective:   Patient ID:  Peggy Sanchez is a 37 y.o. (DOB November 22, 1985) female.  Chief Complaint:  Chief Complaint  Patient presents with   Follow-up    Anxiety, depression, ADHD    HPI Peggy Sanchez presents to the office today for follow-up of anxiety, depression, and ADHD. 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 Peggy Sanchez reports that her mood has been lower with surgery and being out of work. Had to take 3 weeks off before surgery. Peggy Sanchez has had some anxiety at times in response to stressors. Peggy Sanchez reports anxiety has been manageable. Energy and motivation have been ok. Sleep is adequate overall. Appetite has been good. Concentration has been adequate. Denies any significant difference between Vyvanse 30 mg and 40 mg or brand vs generic. Denies SI.   Reports family history of DVT and stroke.  Divorce has been finalized. Equitable distribution has not be finalized. Some family stressors involving sister.   Vyvanse last filled 05/05/22.    Past Psychiatric Medication Trials: Sertraline- Helped initially for several years at 25 mg and then increased to 50 mg daily. Had affective dulling and fatigue with 75 mg.  Prozac- Helped initially. Was on 20 mg and increased to 30 mg at the start of the pandemic. Dose was increased to 40 mg and now feels "exhausted" and wants to sleep. No longer seems to be as helpful. Has been more tired on 40 mg with some improvement.  Wellbutrin XL Klonopin Adderall XR- took 20 mg in college and at one point was on 20 mg BID Had some anxiety. Vyvanse   Review of Systems:  Review of Systems  Musculoskeletal:  Negative for gait problem.       Knee pain  Psychiatric/Behavioral:         Please refer to HPI    Medications: I have reviewed the patient's current medications.  Current Outpatient Medications  Medication Sig Dispense Refill   celecoxib (CELEBREX) 100 MG capsule Take 100 mg by mouth daily.     [name] 3-0.02  MG tablet Take 1 tablet by mouth daily.     ALPRAZolam (XANAX) 0.5 MG tablet Take 1/2 tab po qd prn panic and severe anxiety (Patient not taking: Reported on 12/04/2021) 8 tablet 0   buPROPion (WELLBUTRIN XL) 150 MG 24 hr tablet Take 1 tablet (150 mg total) by mouth daily. 90 tablet 1   lisdexamfetamine (VYVANSE) 30 MG capsule Take 1 capsule (30 mg total) by mouth daily. 30 capsule 0   [START ON 07/02/2022] lisdexamfetamine (VYVANSE) 30 MG capsule Take 1 capsule (30 mg total) by mouth daily. 30 capsule 0   [START ON 07/30/2022] lisdexamfetamine (VYVANSE) 30 MG capsule Take 1 capsule (30 mg total) by mouth daily. 30 capsule 0   lisdexamfetamine (VYVANSE) 40 MG capsule Take 1 capsule (40 mg total) by mouth every morning. 30 capsule 0   Misc. Devices (ALL-BODY MASSAGE) MISC Full body and paracervical massage 3 times weekly, medically necessary. 1 each 0   sertraline (ZOLOFT) 50 MG tablet Take 1 tablet (50 mg total) by mouth daily. 90 tablet 1   triamcinolone cream (KENALOG) 0.5 % Apply 1 application topically 2 (two) times daily. To affected areas. (Patient not taking: Reported on 11/22/2020) 30 g 3   No current facility-administered medications for this visit.    Medication Side Effects: None  Allergies: No Known Allergies  Past Medical History:  Diagnosis Date   Dental crowns present    Difficult  intravenous access    Indication for care in labor or delivery 06/25/2015   Interstitial cystitis    Medial meniscus tear 04/23/2014   left knee   Postpartum care following vaginal delivery (4/4) 06/25/2015   Pregnancy with third trimester bleeding, antepartum 06/25/2015   Raynaud's disease     Past Medical History, Surgical history, Social history, and Family history were reviewed and updated as appropriate.   Please see review of systems for further details on the patient's review from today.   Objective:   Physical Exam:  BP 115/81   Pulse 69   Physical Exam Constitutional:       General: Peggy Sanchez is not in acute distress. Musculoskeletal:        General: No deformity.  Neurological:     Mental Status: Peggy Sanchez is alert and oriented to person, place, and time.     Coordination: Coordination normal.  Psychiatric:        Attention and Perception: Attention and perception normal. Peggy Sanchez does not perceive auditory or visual hallucinations.        Mood and Affect: Affect is not labile, blunt, angry or inappropriate.        Speech: Speech normal.        Behavior: Behavior normal.        Thought Content: Thought content normal. Thought content is not paranoid or delusional. Thought content does not include homicidal or suicidal ideation. Thought content does not include homicidal or suicidal plan.        Cognition and Memory: Cognition and memory normal.        Judgment: Judgment normal.     Comments: Insight intact Mild anxiety in response to surgery     Lab Review:     Component Value Date/Time   NA 136 06/25/2015 0805   K 3.5 06/25/2015 0805   CL 107 06/25/2015 0805   CO2 25 06/25/2015 0805   GLUCOSE 76 06/25/2015 0805   BUN 8 06/25/2015 0805   CREATININE 0.64 06/25/2015 0805   CALCIUM 8.5 (L) 06/25/2015 0805   PROT 6.3 (L) 06/25/2015 0805   ALBUMIN 2.8 (L) 06/25/2015 0805   AST 18 06/25/2015 0805   ALT 10 (L) 06/25/2015 0805   ALKPHOS 155 (H) 06/25/2015 0805   BILITOT 0.4 06/25/2015 0805   GFRNONAA >60 06/25/2015 0805   GFRAA >60 06/25/2015 0805       Component Value Date/Time   WBC 11.6 (H) 06/26/2015 0502   RBC 3.59 (L) 06/26/2015 0502   HGB 11.1 (L) 06/26/2015 0502   HCT 32.0 (L) 06/26/2015 0502   PLT 223 06/26/2015 0502   MCV 89.1 06/26/2015 0502   MCH 30.9 06/26/2015 0502   MCHC 34.7 06/26/2015 0502   RDW 13.1 06/26/2015 0502    No results found for: "POCLITH", "LITHIUM"   No results found for: "PHENYTOIN", "PHENOBARB", "VALPROATE", "CBMZ"   .res Assessment: Plan:    Pt seen for 35 minutes and time spent discussing how recent surgery and  medical leave has affected mood and anxiety.  Also discussed MTHFR mutation since Peggy Sanchez reports that her mother may have this mutation and there is a strong family history of clotting, to include PE, DVT, and CVA.  Peggy Sanchez reports that Peggy Sanchez is interested in testing for MTHFR. Discussed potential benefits of pharmacogenetics testing and that testing may also provide helpful information about adjustment of other medications.  Patient consents to pharmacogenetic testing and saliva sample obtained at time of exam.  Will continue Vyvanse 30 mg daily  for ADHD. Will continue sertraline 50 mg daily for anxiety and depression. Continue Wellbutrin XL 150 mg daily for depression. Recommend continuing therapy with Lina Sayre, [facility].  Pt to follow-up in 3 months or sooner if clinically indicated.  Patient advised to contact office with any questions, adverse effects, or acute worsening in signs and symptoms.   Malan was seen today for follow-up.  Diagnoses and all orders for this visit:  Attention deficit hyperactivity disorder (ADHD), combined type -     lisdexamfetamine (VYVANSE) 30 MG capsule; Take 1 capsule (30 mg total) by mouth daily. -     lisdexamfetamine (VYVANSE) 30 MG capsule; Take 1 capsule (30 mg total) by mouth daily. -     lisdexamfetamine (VYVANSE) 30 MG capsule; Take 1 capsule (30 mg total) by mouth daily.  PTSD (post-traumatic stress disorder) -     sertraline (ZOLOFT) 50 MG tablet; Take 1 tablet (50 mg total) by mouth daily.  Depression, unspecified depression type -     sertraline (ZOLOFT) 50 MG tablet; Take 1 tablet (50 mg total) by mouth daily. -     buPROPion (WELLBUTRIN XL) 150 MG 24 hr tablet; Take 1 tablet (150 mg total) by mouth daily.     Please see After Visit Summary for patient specific instructions.  Future Appointments  Date Time Provider [place]  07/22/2022 11:00 AM Lina Sayre, [facility] CP-CP None  08/05/2022 11:00 AM Lina Sayre, [facility] CP-CP None  08/19/2022   8:00 AM Lina Sayre, [facility] CP-CP None  09/04/2022  8:30 AM Thayer Headings, PM[facility]NP CP-CP None    No orders of the defined types were placed in this encounter.   -------------------------------
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
## 2022-06-25 ENCOUNTER — Telehealth: Payer: Self-pay | Admitting: Psychiatry

## 2022-06-25 NOTE — Telephone Encounter (Signed)
Attempted to contact pt to discuss [facility] results. L/M that results were received and that copy would be left upfront for her to pick up. Advised pt to contact office with any questions.

## 2022-07-13 ENCOUNTER — Telehealth: Payer: Self-pay | Admitting: Psychiatry

## 2022-07-13 DIAGNOSIS — Z1589 Genetic susceptibility to other disease: Secondary | ICD-10-CM

## 2022-07-13 DIAGNOSIS — F4310 Post-traumatic stress disorder, unspecified: Secondary | ICD-10-CM

## 2022-07-13 MED ORDER — ALPRAZOLAM 0.5 MG PO TABS: ORAL_TABLET | ORAL | 1 refills | Status: DC

## 2022-07-13 MED ORDER — L-METHYLFOLATE 15 MG PO TABS: 15.0000 mg | ORAL_TABLET | Freq: Every day | ORAL | 5 refills | Status: DC

## 2022-07-13 NOTE — Telephone Encounter (Signed)
Please let her know a script has been sent for Alprazolam. [facility] allows me to send her an encrypted copy electronically if that would be easier. Does she give permission for that to be sent to her, and if so, does she prefer text or email?

## 2022-07-13 NOTE — Telephone Encounter (Signed)
Patient gave permission to send results via text. She is also asking for Rx for folate.
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
## 2022-07-13 NOTE — Telephone Encounter (Signed)
Pt called at 11:14a.  She said she is trying to get test results.  Also she started crying and saying that she is having trouble with her son, he's running away and it's causing her to panic.  Pls call her back to discuss.  Next appt 6/14

## 2022-07-13 NOTE — Telephone Encounter (Signed)
Results and script have been sent.

## 2022-07-22 ENCOUNTER — Ambulatory Visit (INDEPENDENT_AMBULATORY_CARE_PROVIDER_SITE_OTHER): Payer: No Typology Code available for payment source | Admitting: Psychiatry

## 2022-07-22 DIAGNOSIS — F4310 Post-traumatic stress disorder, unspecified: Secondary | ICD-10-CM

## 2022-07-22 MED ORDER — L-METHYLFOLATE 15 MG PO TABS: 15.0000 mg | ORAL_TABLET | Freq: Every day | ORAL | 5 refills | Status: DC

## 2022-07-22 MED ORDER — ALPRAZOLAM 0.5 MG PO TABS: ORAL_TABLET | ORAL | 1 refills | Status: DC

## 2022-07-22 NOTE — Telephone Encounter (Signed)
Pt notified office that pharmacy does not have script for Alprazolam.Staff report that transmission status for Alprazolam was "Phone in" instead of "Normal." PDMP does not show recent Alprazolam fill. Script sent electronically to pharmacy. Pt also requesting L-Methylfolate script be sent to pharmacy. Script re-submitted.
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
## 2022-08-05 ENCOUNTER — Ambulatory Visit: Payer: No Typology Code available for payment source | Admitting: Psychiatry

## 2022-08-19 ENCOUNTER — Ambulatory Visit (INDEPENDENT_AMBULATORY_CARE_PROVIDER_SITE_OTHER): Payer: No Typology Code available for payment source | Admitting: Psychiatry

## 2022-08-19 DIAGNOSIS — F4310 Post-traumatic stress disorder, unspecified: Secondary | ICD-10-CM | POA: Diagnosis not present

## 2022-08-19 NOTE — Progress Notes (Signed)
      [facility] Counselor/Therapist Progress Note  Patient ID: Peggy Sanchez, MRN: [id number],    Date: 08/19/2022  Time Spent: 50 minutes start time 8:09 AM end time 8:59 AM  Treatment Type: Individual Therapy  Reported Symptoms: anxiety, triggered responses, sadness  Mental Status Exam:  Appearance:   Well Groomed     Behavior:  Appropriate  Motor:  Normal  Speech/Language:   Normal Rate  Affect:  Appropriate  Mood:  anxious  Thought process:  normal  Thought content:    WNL  Sensory/Perceptual disturbances:    WNL  Orientation:  oriented to person, place, time/date, and situation  Attention:  Good  Concentration:  Good  Memory:  WNL  Fund of knowledge:   Good  Insight:    Good  Judgment:   Good  Impulse Control:  Good   Risk Assessment: Danger to Self:  No Self-injurious Behavior: No Danger to Others: No Duty to Warn:no Physical Aggression / Violence:No  Access to Firearms a concern: No  Gang Involvement:No   Subjective: Patient was present for session. She reported she and the kids are moving into a new home. She shared that there have been issues that have made it better for her to be on her own with her kids.  Patient went on to explain that her oldest son Peggy Sanchez is continuing to have extreme behaviors.  She is able to text her regularly and is concerned about his behaviors.  Even during session she got a text message from the school.  Patient shared that his psychiatrist has asked that he stay at 1 parents to give consistency while his medication and things are trying to be regulated.  Patient explained she is still concerned about the situation because he is a full-time and dad currently and his dad does not have the consistency that she has with him.  Patient was allowed time to discuss the situation and encouraged to try and focus on what she can control fix and change and to think through how she wants to progress in the future to make the best decision for  herself and her children.  The importance of safety and trying to work with her ex on how to maintain safety for all 3 boys was addressed in session.   Interventions: Solution-Oriented/Positive Psychology  Diagnosis:   ICD-10-CM   1. PTSD (post-traumatic stress disorder)  F43.10       Plan:  Patient is to use CBT and coping skills to decrease triggered responses.  Patient is to follow plans from session to continue working with her attorney on making sure that her children are safe.  Patient encouraged to continue watching DBT skill for emotional regulation with her children and practice them herself.  Patient is to remind herself of the truth and figure out ways to simplify her life situations.  Patient is to continue exercising to release negative emotions appropriately.  Patient is to  remind herself to focus on the things that she can control fix and change and work on journaling when she gets very triggered rather than responding in the moment.   Patient is to take medication as directed. Long-term goal: Recall the traumatic event without becoming overwhelming negative emotions Short-term goal: Identify the symptoms of PTSD that have caused distress and impaired functioning  Stevphen Meuse, [facility]

## 2022-08-20 NOTE — Patient Instructions (Signed)
Patient

## 2022-09-01 ENCOUNTER — Ambulatory Visit: Payer: No Typology Code available for payment source | Admitting: Psychiatry

## 2022-09-04 ENCOUNTER — Ambulatory Visit: Payer: No Typology Code available for payment source | Admitting: Psychiatry

## 2022-09-16 ENCOUNTER — Encounter: Payer: Self-pay | Admitting: Psychiatry

## 2022-09-16 ENCOUNTER — Ambulatory Visit (INDEPENDENT_AMBULATORY_CARE_PROVIDER_SITE_OTHER): Payer: No Typology Code available for payment source | Admitting: Psychiatry

## 2022-09-16 DIAGNOSIS — F4310 Post-traumatic stress disorder, unspecified: Secondary | ICD-10-CM | POA: Diagnosis not present

## 2022-09-16 DIAGNOSIS — F32A Depression, unspecified: Secondary | ICD-10-CM | POA: Diagnosis not present

## 2022-09-16 DIAGNOSIS — F902 Attention-deficit hyperactivity disorder, combined type: Secondary | ICD-10-CM

## 2022-09-16 DIAGNOSIS — Z1589 Genetic susceptibility to other disease: Secondary | ICD-10-CM

## 2022-09-16 MED ORDER — BUPROPION HCL ER (XL) 150 MG PO TB24
150.0000 mg | ORAL_TABLET | Freq: Every day | ORAL | 1 refills | Status: DC
Start: 2022-09-16 — End: 2022-12-16

## 2022-09-16 MED ORDER — LISDEXAMFETAMINE DIMESYLATE 30 MG PO CAPS
30.0000 mg | ORAL_CAPSULE | Freq: Every day | ORAL | 0 refills | Status: DC
Start: 2022-11-24 — End: 2022-12-16

## 2022-09-16 MED ORDER — LISDEXAMFETAMINE DIMESYLATE 30 MG PO CAPS
30.0000 mg | ORAL_CAPSULE | Freq: Every day | ORAL | 0 refills | Status: DC
Start: 2022-09-29 — End: 2022-12-16

## 2022-09-16 MED ORDER — LISDEXAMFETAMINE DIMESYLATE 30 MG PO CAPS
30.0000 mg | ORAL_CAPSULE | Freq: Every day | ORAL | 0 refills | Status: DC
Start: 2022-10-27 — End: 2022-12-16

## 2022-09-16 MED ORDER — SERTRALINE HCL 50 MG PO TABS
50.0000 mg | ORAL_TABLET | Freq: Every day | ORAL | 1 refills | Status: DC
Start: 2022-09-16 — End: 2022-12-16

## 2022-09-16 NOTE — Progress Notes (Unsigned)
Peggy Sanchez [id number] 1985/09/11 37 y.o.  Subjective:   Patient ID:  Peggy Sanchez is a 37 y.o. (DOB March 17, 1986) female.  Chief Complaint:  Chief Complaint  Patient presents with   Follow-up    Anxiety, ADHD    HPI [name] presents to the office today for follow-up of anxiety, ADHD, and history of depression. She now has her own house on [place] acres and parents are 8 minutes away. She is no longer in relationship. She is enjoying her new place. "I feel so much better." Oldest son is now at a summer camp for children with Autism and they have a plan for school next year. They now have a Pharmacist, community. She reports that her anxiety and stress are less now with improvement in psychosocial stressors. Denies any recent panic.   Denies depressed mood. Energy and motivation have been good. She reports adequate concentration. Appetite has been good. Sleeping well. Denies SI.   She now has her children one week on and one week off.   Vyvanse last filled 09/01/22.   Past Psychiatric Medication Trials: Sertraline- Helped initially for several years at 25 mg and then increased to 50 mg daily. Had affective dulling and fatigue with 75 mg.  Prozac- Helped initially. Was on 20 mg and increased to 30 mg at the start of the pandemic. Dose was increased to 40 mg and now feels "exhausted" and wants to sleep. No longer seems to be as helpful. Has been more tired on 40 mg with some improvement.  Wellbutrin XL Klonopin Adderall XR- took 20 mg in college and at one point was on 20 mg BID Had some anxiety. Vyvanse  Review of Systems:  Review of Systems  Cardiovascular:  Negative for palpitations.  Musculoskeletal:  Negative for gait problem.       Some pain and stiffness after ACL repair  Psychiatric/Behavioral:         Please refer to HPI    Medications: I have reviewed the patient's current medications.  Current Outpatient Medications  Medication Sig Dispense Refill    ALPRAZolam (XANAX) 0.5 MG tablet Take 1/2 tab po qd prn panic and severe anxiety 15 tablet 1   buPROPion (WELLBUTRIN XL) 150 MG 24 hr tablet Take 1 tablet (150 mg total) by mouth daily. 90 tablet 1   celecoxib (CELEBREX) 100 MG capsule Take 100 mg by mouth daily.     L-Methylfolate 15 MG TABS Take 1 tablet (15 mg total) by mouth daily at 6 (six) AM. 30 tablet 5   lisdexamfetamine (VYVANSE) 30 MG capsule Take 1 capsule (30 mg total) by mouth daily. 30 capsule 0   lisdexamfetamine (VYVANSE) 30 MG capsule Take 1 capsule (30 mg total) by mouth daily. 30 capsule 0   lisdexamfetamine (VYVANSE) 30 MG capsule Take 1 capsule (30 mg total) by mouth daily. 30 capsule 0   lisdexamfetamine (VYVANSE) 40 MG capsule Take 1 capsule (40 mg total) by mouth every morning. 30 capsule 0   Misc. Devices (ALL-BODY MASSAGE) MISC Full body and paracervical massage 3 times weekly, medically necessary. 1 each 0   [name] 3-0.02 MG tablet Take 1 tablet by mouth daily.     sertraline (ZOLOFT) 50 MG tablet Take 1 tablet (50 mg total) by mouth daily. 90 tablet 1   triamcinolone cream (KENALOG) 0.5 % Apply 1 application topically 2 (two) times daily. To affected areas. (Patient not taking: Reported on 11/22/2020) 30 g 3   No current facility-administered medications for this visit.  Medication Side Effects: None  Allergies: No Known Allergies  Past Medical History:  Diagnosis Date   Dental crowns present    Difficult intravenous access    Indication for care in labor or delivery 06/25/2015   Interstitial cystitis    Medial meniscus tear 04/23/2014   left knee   Postpartum care following vaginal delivery (4/4) 06/25/2015   Pregnancy with third trimester bleeding, antepartum 06/25/2015   Raynaud's disease     Past Medical History, Surgical history, Social history, and Family history were reviewed and updated as appropriate.   Please see review of systems for further details on the patient's review from today.    Objective:   Physical Exam:  There were no vitals taken for this visit.  Physical Exam  Lab Review:     Component Value Date/Time   NA 136 06/25/2015 0805   K 3.5 06/25/2015 0805   CL 107 06/25/2015 0805   CO2 25 06/25/2015 0805   GLUCOSE 76 06/25/2015 0805   BUN 8 06/25/2015 0805   CREATININE 0.64 06/25/2015 0805   CALCIUM 8.5 (L) 06/25/2015 0805   PROT 6.3 (L) 06/25/2015 0805   ALBUMIN 2.8 (L) 06/25/2015 0805   AST 18 06/25/2015 0805   ALT 10 (L) 06/25/2015 0805   ALKPHOS 155 (H) 06/25/2015 0805   BILITOT 0.4 06/25/2015 0805   GFRNONAA >60 06/25/2015 0805   GFRAA >60 06/25/2015 0805       Component Value Date/Time   WBC 11.6 (H) 06/26/2015 0502   RBC 3.59 (L) 06/26/2015 0502   HGB 11.1 (L) 06/26/2015 0502   HCT 32.0 (L) 06/26/2015 0502   PLT 223 06/26/2015 0502   MCV 89.1 06/26/2015 0502   MCH 30.9 06/26/2015 0502   MCHC 34.7 06/26/2015 0502   RDW 13.1 06/26/2015 0502    No results found for: "POCLITH", "LITHIUM"   No results found for: "PHENYTOIN", "PHENOBARB", "VALPROATE", "CBMZ"   .res Assessment: Plan:    There are no diagnoses linked to this encounter.   Please see After Visit Summary for patient specific instructions.  Future Appointments  Date Time Provider Department Center  10/15/2022 12:00 PM Stevphen Meuse, [facility] CP-CP None  10/29/2022  8:00 AM Stevphen Meuse, [facility] CP-CP None  11/12/2022  8:00 AM Stevphen Meuse, [facility] CP-CP None  11/26/2022  8:00 AM Stevphen Meuse, [facility] CP-CP None    No orders of the defined types were placed in this encounter.   -------------------------------

## 2022-10-15 ENCOUNTER — Ambulatory Visit: Payer: No Typology Code available for payment source | Admitting: Psychiatry

## 2022-10-29 ENCOUNTER — Ambulatory Visit (INDEPENDENT_AMBULATORY_CARE_PROVIDER_SITE_OTHER): Payer: No Typology Code available for payment source | Admitting: Psychiatry

## 2022-11-12 ENCOUNTER — Ambulatory Visit (INDEPENDENT_AMBULATORY_CARE_PROVIDER_SITE_OTHER): Payer: No Typology Code available for payment source | Admitting: Psychiatry

## 2022-11-12 DIAGNOSIS — F4310 Post-traumatic stress disorder, unspecified: Secondary | ICD-10-CM

## 2022-11-12 NOTE — Progress Notes (Signed)
      [facility] Counselor/Therapist Progress Note  Patient ID: Peggy Sanchez, MRN: [id number],    Date: 11/12/2022  Time Spent: 47 minutes start time 8:13 AM  Treatment Type: Individual Therapy  Reported Symptoms: flashbacks, triggered responses, anxiety, sadness  Mental Status Exam:  Appearance:   Well Groomed     Behavior:  Appropriate  Motor:  Normal  Speech/Language:   Normal Rate  Affect:  Appropriate  Mood:  normal  Thought process:  normal  Thought content:    WNL  Sensory/Perceptual disturbances:    WNL  Orientation:  oriented to person, place, time/date, and situation  Attention:  Good  Concentration:  Good  Memory:  WNL  Fund of knowledge:   Good  Insight:    Good  Judgment:   Good  Impulse Control:  Good   Risk Assessment: Danger to Self:  No Self-injurious Behavior: No Danger to Others: No Duty to Warn:no Physical Aggression / Violence:No  Access to Firearms a concern: No  Gang Involvement:No   Subjective: Patient was present for session. Things are still difficult with her son, he has gotten kicked out of 2 more programs.  Patient went on to explain that she and her ex-husband are trying to work together to coparent.  Through spending time together they are trying to assess whether or not there is hope for their relationship since it seems almost impossible to manage the 3 children on their own.  Patient reported she still gets very triggered and overwhelmed with the behaviors of her son so it helps knowing that their 2 homes that he can go to in different situations.  Patient was encouraged to recognize she does not have to make a decision at this point and figuring out what the needs are for her kids and was important she and her ex-husband reconnect with 2 different homes so they can work better together as parents then that is not the worst option.  Patient was encouraged to figure out ways that she can take care of herself and make her situation functional  so she does not get so overwhelmed and triggered.  Developed plan and treatment goals with patient  Interventions: Cognitive Behavioral Therapy and Solution-Oriented/Positive Psychology  Diagnosis:   ICD-10-CM   1. PTSD (post-traumatic stress disorder)  F43.10       Plan:  Patient is to use CBT and coping skills to decrease triggered responses.  Patient encouraged to continue watching DBT skill for emotional regulation with her children and practice them herself.  Patient is to remind herself of the truth and figure out ways to simplify her life situations.  Patient is to continue exercising to release negative emotions appropriately.  Patient is to  remind herself to focus on the things that she can control fix and change and work on journaling when she gets very triggered rather than responding in the moment.   Patient is to take medication as directed. Long-term goal: Recall the traumatic event without becoming overwhelming negative emotions Short-term goal: Identify the symptoms of PTSD that have caused distress and impaired functioning  Stevphen Meuse, [facility]

## 2022-11-26 ENCOUNTER — Ambulatory Visit: Payer: No Typology Code available for payment source | Admitting: Psychiatry

## 2022-12-16 ENCOUNTER — Encounter: Payer: Self-pay | Admitting: Psychiatry

## 2022-12-16 ENCOUNTER — Ambulatory Visit (INDEPENDENT_AMBULATORY_CARE_PROVIDER_SITE_OTHER): Payer: No Typology Code available for payment source | Admitting: Psychiatry

## 2022-12-16 VITALS — BP 111/76 | HR 67

## 2022-12-16 DIAGNOSIS — F4310 Post-traumatic stress disorder, unspecified: Secondary | ICD-10-CM | POA: Diagnosis not present

## 2022-12-16 DIAGNOSIS — Z1589 Genetic susceptibility to other disease: Secondary | ICD-10-CM | POA: Diagnosis not present

## 2022-12-16 DIAGNOSIS — F32A Depression, unspecified: Secondary | ICD-10-CM

## 2022-12-16 DIAGNOSIS — F902 Attention-deficit hyperactivity disorder, combined type: Secondary | ICD-10-CM

## 2022-12-16 MED ORDER — LISDEXAMFETAMINE DIMESYLATE 30 MG PO CAPS
30.0000 mg | ORAL_CAPSULE | Freq: Every day | ORAL | 0 refills | Status: DC
Start: 2023-02-24 — End: 2023-03-29

## 2022-12-16 MED ORDER — LISDEXAMFETAMINE DIMESYLATE 30 MG PO CAPS: 30.0000 mg | ORAL_CAPSULE | Freq: Every day | ORAL | 0 refills | Status: DC

## 2022-12-16 MED ORDER — AUVELITY 45-105 MG PO TBCR: 1.0000 | EXTENDED_RELEASE_TABLET | Freq: Two times a day (BID) | ORAL | 2 refills | Status: DC

## 2022-12-16 MED ORDER — SERTRALINE HCL 50 MG PO TABS
50.0000 mg | ORAL_TABLET | Freq: Every day | ORAL | 1 refills | Status: DC
Start: 2022-12-16 — End: 2023-01-14

## 2022-12-16 MED ORDER — L-METHYLFOLATE 15 MG PO TABS: 15.0000 mg | ORAL_TABLET | Freq: Every day | ORAL | 5 refills | Status: DC

## 2022-12-16 MED ORDER — LISDEXAMFETAMINE DIMESYLATE 30 MG PO CAPS
30.0000 mg | ORAL_CAPSULE | Freq: Every day | ORAL | 0 refills | Status: DC
Start: 2023-01-27 — End: 2023-03-29

## 2022-12-16 MED ORDER — AUVELITY 45-105 MG PO TBCR
EXTENDED_RELEASE_TABLET | ORAL | Status: DC
Start: 2022-12-16 — End: 2023-01-14

## 2022-12-31 ENCOUNTER — Telehealth: Payer: Self-pay

## 2022-12-31 NOTE — Telephone Encounter (Signed)
Pt reports that her pharmacy [facility] was waiting to hear from our office concerning a PA for Auvelity. Initiated a PA through her insurance with [employer] electronically with CMM, response was no PA needed. [facility] [employer] and representative ran the Rx for [place] and said it went through WITHOUT a PA. She reports maybe the pharmacy did not put in the correct [facility] code and if they have anymore trouble to contact [facility] [employer].   Will let patient and pharmacy know.

## 2022-12-31 NOTE — Telephone Encounter (Signed)
Contacted [facility] and they report the prescription is being processed currently.  The pharmacy said they had just been waiting on clarifying something but nothing needed now. Pt aware.  Good to know not all Auvelity Rx's will need a PA.

## 2023-01-14 ENCOUNTER — Encounter: Payer: Self-pay | Admitting: Psychiatry

## 2023-01-14 ENCOUNTER — Ambulatory Visit (INDEPENDENT_AMBULATORY_CARE_PROVIDER_SITE_OTHER): Payer: No Typology Code available for payment source | Admitting: Psychiatry

## 2023-01-14 DIAGNOSIS — F4310 Post-traumatic stress disorder, unspecified: Secondary | ICD-10-CM | POA: Diagnosis not present

## 2023-01-14 DIAGNOSIS — F902 Attention-deficit hyperactivity disorder, combined type: Secondary | ICD-10-CM | POA: Diagnosis not present

## 2023-01-14 DIAGNOSIS — F32A Depression, unspecified: Secondary | ICD-10-CM

## 2023-01-14 MED ORDER — LISDEXAMFETAMINE DIMESYLATE 30 MG PO CAPS
30.0000 mg | ORAL_CAPSULE | Freq: Every day | ORAL | 0 refills | Status: DC
Start: 2023-03-24 — End: 2023-03-29

## 2023-01-14 MED ORDER — SERTRALINE HCL 25 MG PO TABS
25.0000 mg | ORAL_TABLET | Freq: Every day | ORAL | 1 refills | Status: DC
Start: 2023-01-14 — End: 2023-03-29

## 2023-01-14 MED ORDER — AUVELITY 45-105 MG PO TBCR
1.0000 | EXTENDED_RELEASE_TABLET | Freq: Two times a day (BID) | ORAL | 1 refills | Status: DC
Start: 2023-01-14 — End: 2023-03-29

## 2023-01-14 NOTE — Progress Notes (Signed)
Peggy Sanchez [id number] [birth year] 37 y.o.  Subjective:   Patient ID:  Peggy Sanchez is a 37 y.o. (DOB March 20, 1986) female.  Chief Complaint:  Chief Complaint  Patient presents with   Follow-up    Anxiety, depression, and ADHD    HPI Peggy Sanchez presents to the office today for follow-up of anxiety, depression, and ADHD.   Peggy Sanchez reports that anxiety is less with Auvelity and no longer feels nervous before leaving home. Peggy Sanchez denies any current anxiety. Peggy Sanchez initially had some mild dizziness and headaches and this resolved. Denies depressed mood. Peggy Sanchez reports adequate concentration and decision making. Sleeping well. Energy and motivation have been good. Denies anhedonia. Peggy Sanchez reports that her appetite is increased with Auvelity. Denies SI.  Peggy Sanchez reports that Peggy Sanchez and her ex-husband are back together and this has been going well.    Sons are doing well. Peggy Sanchez enjoys sports.  Vyvanse last filled 01/05/23.   Past Psychiatric Medication Trials: Sertraline- Helped initially for several years at 25 mg and then increased to 50 mg daily. Had affective dulling and fatigue with 75 mg.  Prozac- Helped initially. Was on 20 mg and increased to 30 mg at the start of the pandemic. Dose was increased to 40 mg and now feels "exhausted" and wants to sleep. No longer seems to be as helpful. Has been more tired on 40 mg with some improvement.  Wellbutrin XL Klonopin Adderall XR- took 20 mg in college and at one point was on 20 mg BID Had some anxiety. Vyvanse     Review of Systems:  Review of Systems  Cardiovascular:  Negative for palpitations.  Musculoskeletal:  Negative for gait problem.  Neurological:  Negative for dizziness, tremors and headaches.  Psychiatric/Behavioral:         Please refer to HPI    Medications: I have reviewed the patient's current medications.  Current Outpatient Medications  Medication Sig Dispense Refill   L-Methylfolate 15 MG TABS Take 1 tablet (15 mg total)  by mouth daily at 6 (six) AM. 30 tablet 5   [name] 3-0.02 MG tablet Take 1 tablet by mouth daily.     ALPRAZolam (XANAX) 0.5 MG tablet Take 1/2 tab po qd prn panic and severe anxiety (Patient not taking: Reported on 01/14/2023) 15 tablet 1   Dextromethorphan-buPROPion ER (AUVELITY) 45-105 MG TBCR Take 1 tablet by mouth 2 (two) times daily. 180 tablet 1   [START ON 01/27/2023] lisdexamfetamine (VYVANSE) 30 MG capsule Take 1 capsule (30 mg total) by mouth daily. 30 capsule 0   [START ON 02/24/2023] lisdexamfetamine (VYVANSE) 30 MG capsule Take 1 capsule (30 mg total) by mouth daily. 30 capsule 0   [START ON 03/24/2023] lisdexamfetamine (VYVANSE) 30 MG capsule Take 1 capsule (30 mg total) by mouth daily. 30 capsule 0   Misc. Devices (ALL-BODY MASSAGE) MISC Full body and paracervical massage 3 times weekly, medically necessary. 1 each 0   sertraline (ZOLOFT) 25 MG tablet Take 1 tablet (25 mg total) by mouth daily. 90 tablet 1   triamcinolone cream (KENALOG) 0.5 % Apply 1 application topically 2 (two) times daily. To affected areas. (Patient not taking: Reported on 11/22/2020) 30 g 3   No current facility-administered medications for this visit.    Medication Side Effects: None  Allergies: No Known Allergies  Past Medical History:  Diagnosis Date   Dental crowns present    Difficult intravenous access    Indication for care in labor or delivery 06/25/2015   Interstitial cystitis  Medial meniscus tear 04/23/2014   left knee   Postpartum care following vaginal delivery (4/4) 06/25/2015   Pregnancy with third trimester bleeding, antepartum 06/25/2015   Raynaud's disease     Past Medical History, Surgical history, Social history, and Family history were reviewed and updated as appropriate.   Please see review of systems for further details on the patient's review from today.   Objective:   Physical Exam:  There were no vitals taken for this visit.  Physical Exam Constitutional:       General: Peggy Sanchez is not in acute distress. Musculoskeletal:        General: No deformity.  Neurological:     Mental Status: Peggy Sanchez is alert and oriented to person, place, and time.     Coordination: Coordination normal.  Psychiatric:        Attention and Perception: Attention and perception normal. Peggy Sanchez does not perceive auditory or visual hallucinations.        Mood and Affect: Mood normal. Mood is not anxious or depressed. Affect is not labile, blunt, angry or inappropriate.        Speech: Speech normal.        Behavior: Behavior normal.        Thought Content: Thought content normal. Thought content is not paranoid or delusional. Thought content does not include homicidal or suicidal ideation. Thought content does not include homicidal or suicidal plan.        Cognition and Memory: Cognition and memory normal.        Judgment: Judgment normal.     Comments: Insight intact     Lab Review:     Component Value Date/Time   NA 136 06/25/2015 0805   K 3.5 06/25/2015 0805   CL 107 06/25/2015 0805   CO2 25 06/25/2015 0805   GLUCOSE 76 06/25/2015 0805   BUN 8 06/25/2015 0805   CREATININE 0.64 06/25/2015 0805   CALCIUM 8.5 (L) 06/25/2015 0805   PROT 6.3 (L) 06/25/2015 0805   ALBUMIN 2.8 (L) 06/25/2015 0805   AST 18 06/25/2015 0805   ALT 10 (L) 06/25/2015 0805   ALKPHOS 155 (H) 06/25/2015 0805   BILITOT 0.4 06/25/2015 0805   GFRNONAA >60 06/25/2015 0805   GFRAA >60 06/25/2015 0805       Component Value Date/Time   WBC 11.6 (H) 06/26/2015 0502   RBC 3.59 (L) 06/26/2015 0502   HGB 11.1 (L) 06/26/2015 0502   HCT 32.0 (L) 06/26/2015 0502   PLT 223 06/26/2015 0502   MCV 89.1 06/26/2015 0502   MCH 30.9 06/26/2015 0502   MCHC 34.7 06/26/2015 0502   RDW 13.1 06/26/2015 0502    No results found for: "POCLITH", "LITHIUM"   No results found for: "PHENYTOIN", "PHENOBARB", "VALPROATE", "CBMZ"   .res Assessment: Plan:    Will continue Auvelity 45-105 mg one tablet twice daily for  depression. Continue Sertraline 25 mg po every day for anxiety and depression.  Continue Vyvanse 30 mg daily for ADHD.  Pt to follow-up in 3 months or sooner if clinically indicated.  Patient advised to contact office with any questions, adverse effects, or acute worsening in signs and symptoms.   Delisa was seen today for follow-up.  Diagnoses and all orders for this visit:  Depression, unspecified depression type -     sertraline (ZOLOFT) 25 MG tablet; Take 1 tablet (25 mg total) by mouth daily. -     Dextromethorphan-buPROPion ER (AUVELITY) 45-105 MG TBCR; Take 1 tablet by mouth 2 (two)  times daily.  PTSD (post-traumatic stress disorder) -     sertraline (ZOLOFT) 25 MG tablet; Take 1 tablet (25 mg total) by mouth daily.  Attention deficit hyperactivity disorder (ADHD), combined type -     lisdexamfetamine (VYVANSE) 30 MG capsule; Take 1 capsule (30 mg total) by mouth daily.     Please see After Visit Summary for patient specific instructions.  Future Appointments  Date Time Provider Department Center  01/21/2023  8:00 AM Stevphen Meuse, [facility] CP-CP None  02/03/2023  9:00 AM Stevphen Meuse, [facility] CP-CP None  02/17/2023  9:00 AM Stevphen Meuse, [facility] CP-CP None  03/03/2023  9:00 AM Stevphen Meuse, [facility] CP-CP None  04/16/2023  8:30 AM Corie Chiquito, [facility]NP CP-CP None    No orders of the defined types were placed in this encounter.   -------------------------------

## 2023-01-21 ENCOUNTER — Ambulatory Visit (INDEPENDENT_AMBULATORY_CARE_PROVIDER_SITE_OTHER): Payer: No Typology Code available for payment source | Admitting: Psychiatry

## 2023-01-21 DIAGNOSIS — F4310 Post-traumatic stress disorder, unspecified: Secondary | ICD-10-CM

## 2023-01-21 NOTE — Progress Notes (Signed)
      [facility] Counselor/Therapist Progress Note  Patient ID: Peggy Sanchez, MRN: [id number],    Date: 01/21/2023  Time Spent: 52 minutes start time 8:07 AM end time 8:59 AM  Treatment Type: Individual Therapy  Reported Symptoms: anxiety, triggered responses, nightmares  Mental Status Exam:  Appearance:   Casual and Neat     Behavior:  Appropriate  Motor:  Normal  Speech/Language:   Normal Rate  Affect:  Appropriate  Mood:  anxious  Thought process:  normal  Thought content:    WNL  Sensory/Perceptual disturbances:    WNL  Orientation:  oriented to person, place, time/date, and situation  Attention:  Good  Concentration:  Good  Memory:  WNL  Fund of knowledge:   Good  Insight:    Good  Judgment:   Good  Impulse Control:  Good   Risk Assessment: Danger to Self:  No Self-injurious Behavior: No Danger to Others: No Duty to Warn:no Physical Aggression / Violence:No  Access to Firearms a concern: No  Gang Involvement:No   Subjective: Patient was present for session. She shared that things are going well with she and her ex husband and they are back together. She shared that she is now trying to figure out how to handle things with her sister.  Patient did on sister turning others against her, suds level and, negative cognition "it is my fault" felt discuss her betrayal and abandonment in her chest.  Patient was able to reduce suds level to 7.  She was able to recognize she is just not ready at this point to have a meeting with her sister and that that is okay.  Patient was encouraged to continue letting the processing take place over the next few weeks and that we will continue working on it future sessions.  Interventions: Eye Movement Desensitization and Reprocessing (EMDR) and Insight-Oriented  Diagnosis:   ICD-10-CM   1. PTSD (post-traumatic stress disorder)  F43.10       Plan: Patient is to use CBT and coping skills to decrease triggered responses.  Patient is  to continue to allow processing to continue over the next few weeks. Patient encouraged to continue watching DBT skill for emotional regulation with her children and practice them herself.  Patient is to remind herself of the truth and figure out ways to simplify her life situations.  Patient is to continue exercising to release negative emotions appropriately.  Patient is to  remind herself to focus on the things that she can control fix and change and work on journaling when she gets very triggered rather than responding in the moment.   Patient is to take medication as directed. Long-term goal: Recall the traumatic event without becoming overwhelming negative emotions Short-term goal: Identify the symptoms of PTSD that have caused distress and impaired functioning  Stevphen Meuse, [facility]

## 2023-02-03 ENCOUNTER — Encounter: Payer: Self-pay | Admitting: Psychiatry

## 2023-02-03 ENCOUNTER — Ambulatory Visit: Payer: No Typology Code available for payment source | Admitting: Psychiatry

## 2023-02-03 DIAGNOSIS — F4310 Post-traumatic stress disorder, unspecified: Secondary | ICD-10-CM

## 2023-02-03 NOTE — Progress Notes (Unsigned)
      [facility] Counselor/Therapist Progress Note  Patient ID: Peggy Sanchez, MRN: [id number],    Date: 02/03/2023  Time Spent: 50 minutes start time 9:08 AM end time 9:58 AM  Treatment Type: Individual Therapy  Reported Symptoms: triggered responses, anxiety, rumination, focusing issues  Mental Status Exam:  Appearance:   Well Groomed     Behavior:  Appropriate  Motor:  Normal  Speech/Language:   Normal Rate  Affect:  Appropriate  Mood:  anxious  Thought process:  normal  Thought content:    WNL  Sensory/Perceptual disturbances:    WNL  Orientation:  oriented to person, place, time/date, and situation  Attention:  Good  Concentration:  Good  Memory:  WNL  Fund of knowledge:   Good  Insight:    Good  Judgment:   Good  Impulse Control:  Good   Risk Assessment: Danger to Self:  No Self-injurious Behavior: No Danger to Others: No Duty to Warn:no Physical Aggression / Violence:No  Access to Firearms a concern: No  Gang Involvement:No   Subjective: Patient was present for session. She shared she felt that last session was helpful and she is feeling better about her choices with her sister.  She went on to share that she has been thinking about her kids more and all that they are exposed to and the future. Her son is working with a new therapist but it is in the beginning stages. She went on to share how he is feeling. She had to set a limit with her brother who was drinking too much.  As she was talking she realized that the biggest issue still for her was her sister abandoning her and she decided she wanted to process that issue, suds level 10, negative cognition "I make bad choices" patient reported feeling very disconnected from her body and felt the numbness in her head.  Patient was able to reduce suds level to 2 and was able to recognize again that at this point she is making the right choices but not feeling the need to work on the relationship.  She was able to  recognize her focus needs to be on her children and figuring out what she is going to do with her relationship with her ex-husband.  Ways for her to continue reminding herself where she needs to focus currently were discussed in session.  Interventions: Eye Movement Desensitization and Reprocessing (EMDR) and Insight-Oriented  Diagnosis:   ICD-10-CM   1. PTSD (post-traumatic stress disorder)  F43.10       Plan:  Patient is to use CBT and coping skills to decrease triggered responses.  Patient is to continue to allow processing to continue over the next few weeks. Patient encouraged to continue watching DBT skill for emotional regulation with her children and practice them herself.  Patient is to remind herself of the truth and figure out ways to simplify her life situations.  Patient is to continue exercising to release negative emotions appropriately.  Patient is to  remind herself to focus on the things that she can control fix and change and work on journaling when she gets very triggered rather than responding in the moment.   Patient is to take medication as directed.  Think through how to handle it if she runs into her sister.  Stevphen Meuse, [facility]

## 2023-02-17 ENCOUNTER — Ambulatory Visit: Payer: No Typology Code available for payment source | Admitting: Psychiatry

## 2023-03-03 ENCOUNTER — Ambulatory Visit: Payer: No Typology Code available for payment source | Admitting: Psychiatry

## 2023-03-03 DIAGNOSIS — F4310 Post-traumatic stress disorder, unspecified: Secondary | ICD-10-CM | POA: Diagnosis not present

## 2023-03-03 NOTE — Progress Notes (Signed)
[facility] Counselor/Therapist Progress Note  Patient ID: Peggy Sanchez, MRN: [id number],    Date: 03/03/2023  Time Spent: 55 minutes start time 9:03 AM end time 9:58 AM  Treatment Type: Individual Therapy  Reported Symptoms: anxiety, triggered responses, rumination, focusing issues  Mental Status Exam:  Appearance:   Well Groomed     Behavior:  Appropriate  Motor:  Normal  Speech/Language:   Normal Rate  Affect:  Appropriate  Mood:  constricted  Thought process:  normal  Thought content:    WNL  Sensory/Perceptual disturbances:    WNL  Orientation:  oriented to person, place, time/date, and situation  Attention:  Good  Concentration:  Good  Memory:  WNL  Fund of knowledge:   Good  Insight:    Good  Judgment:   Good  Impulse Control:  Good   Risk Assessment: Danger to Self:  No Self-injurious Behavior: No Danger to Others: No Duty to Warn:no Physical Aggression / Violence:No  Access to Firearms a concern: No  Gang Involvement:No   Subjective: Patient was present for session. She shared that things are still difficult with her sister and the stress has caused her mother's back to have issues.  Patient stated it has been very upsetting for her and she is concerned about her mother.  She went on to share that she is still having anxiety about running into her sister in public.  Had patient think through the facts of the situation and to recognize what she has done and what is her issues and also what is her sister's issues.  Through the processing patient was able to recognize that she has a tendency to take on things that are not her issues.  Patient also was able to recall an incident that happened when she was a teenager and her mother's back went out when they were in a dressing room at a store.  She shared that the whole situation was overwhelming for her and she felt at that time it was her fault.  Encouraged patient to recognize that just because stress impacts  her mother negatively her mom's back going out when she was a teenager was not her fault.  She was encouraged to affirm that younger part of her.  Discussed cognitive distortions with patient and gave her a handout on the distortions and ways to and twist her thinking.  Patient was able to identify that she does talk to herself very negatively.  Encouraged her to not tell herself anything she would not tell any teenager.  Patient shared that the other issue is having to wrap things up with her husband regarding their settlement after their divorce.  Recognize that they are currently back together but they still needed to have that chapter closed and then figure out what is next for their relationship.  Discussed different ways that she could communicate what she was feeling and needing with her husband.  Interventions: Cognitive Behavioral Therapy, Solution-Oriented/Positive Psychology, and Insight-Oriented  Diagnosis:   ICD-10-CM   1. PTSD (post-traumatic stress disorder)  F43.10       Plan:  Patient is to use CBT and coping skills to decrease triggered responses.  Patient is to practice handouts from session on this cognitive distortions and how to untwisting.  Patient is also to remind herself regularly she is not responsible for other people's emotions.  Patient is to talk to her husband/ex-husband about trying to finish up the distribution stuff from court so they can move  forward in the relationship. Patient encouraged to continue watching DBT skill for emotional regulation with her children and practice them herself.  Patient is to remind herself of the truth and figure out ways to simplify her life situations.  Patient is to continue exercising to release negative emotions appropriately.  Patient is to  remind herself to focus on the things that she can control fix and change and work on journaling when she gets very triggered rather than responding in the moment.   Patient is to take medication as  directed.   Stevphen Meuse, [facility]

## 2023-03-29 ENCOUNTER — Encounter: Payer: Self-pay | Admitting: Psychiatry

## 2023-03-29 ENCOUNTER — Telehealth (INDEPENDENT_AMBULATORY_CARE_PROVIDER_SITE_OTHER): Payer: No Typology Code available for payment source | Admitting: Psychiatry

## 2023-03-29 DIAGNOSIS — F4310 Post-traumatic stress disorder, unspecified: Secondary | ICD-10-CM | POA: Diagnosis not present

## 2023-03-29 DIAGNOSIS — F32A Depression, unspecified: Secondary | ICD-10-CM

## 2023-03-29 DIAGNOSIS — Z1589 Genetic susceptibility to other disease: Secondary | ICD-10-CM | POA: Diagnosis not present

## 2023-03-29 DIAGNOSIS — F902 Attention-deficit hyperactivity disorder, combined type: Secondary | ICD-10-CM

## 2023-03-29 MED ORDER — ALPRAZOLAM 0.5 MG PO TABS
ORAL_TABLET | ORAL | 1 refills | Status: AC
Start: 2023-03-29 — End: ?

## 2023-03-29 MED ORDER — LISDEXAMFETAMINE DIMESYLATE 40 MG PO CAPS
40.0000 mg | ORAL_CAPSULE | ORAL | 0 refills | Status: DC
Start: 2023-03-29 — End: 2023-04-08

## 2023-03-29 MED ORDER — LISDEXAMFETAMINE DIMESYLATE 40 MG PO CAPS
40.0000 mg | ORAL_CAPSULE | ORAL | 0 refills | Status: DC
Start: 2023-04-26 — End: 2023-04-08

## 2023-03-29 MED ORDER — LISDEXAMFETAMINE DIMESYLATE 40 MG PO CAPS
40.0000 mg | ORAL_CAPSULE | ORAL | 0 refills | Status: DC
Start: 2023-05-24 — End: 2023-04-08

## 2023-03-29 MED ORDER — L-METHYLFOLATE 15 MG PO TABS: 15.0000 mg | ORAL_TABLET | Freq: Every day | ORAL | 5 refills | Status: AC

## 2023-03-29 MED ORDER — AUVELITY 45-105 MG PO TBCR: 1.0000 | EXTENDED_RELEASE_TABLET | Freq: Two times a day (BID) | ORAL | 1 refills | Status: AC

## 2023-03-29 MED ORDER — SERTRALINE HCL 50 MG PO TABS: 50.0000 mg | ORAL_TABLET | Freq: Every day | ORAL | 1 refills | Status: AC

## 2023-03-29 NOTE — Progress Notes (Signed)
 Peggy Sanchez [id number] Sep 27, 1985 38 y.o.  Virtual Visit via Video Note  I connected with pt @ on 03/29/23 at 11:30 AM EST by a video enabled telemedicine application and verified that I am speaking with the correct person using two identifiers.   I discussed the limitations of evaluation and management by telemedicine and the availability of in person appointments. The patient expressed understanding and agreed to proceed.  I discussed the assessment and treatment plan with the patient. The patient was provided an opportunity to ask questions and all were answered. The patient agreed with the plan and demonstrated an understanding of the instructions.   The patient was advised to call back or seek an in-person evaluation if the symptoms worsen or if the condition fails to improve as anticipated.  I provided 34 minutes of non-face-to-face time during this encounter.  The patient was located at home.  The provider was located at home.   Peggy Sanchez, PMHNP   Subjective:   Patient ID:  Peggy Sanchez is a 38 y.o. (DOB 01-07-1986) female.  Chief Complaint:  Chief Complaint  Patient presents with   Follow-up    Anxiety, depression, ADHD    HPI Peggy Sanchez presents for follow-up of ADHD, anxiety, and depression. She reports that she had to increase Sertraline  back to 50 mg daily and felt short-tempered over the holidays. Denies increased anxiety or depression. She reports that increase in Sertraline  was helpful for her irritability. Sleeping well. Energy and motivation have been adequate. She reports that others have noticed she has some hyper-activity. She reports that she is easily distracted. She reports that she missed an apt with therapist due to forgetting appointment. She reports that she sometimes will forget to submit reports.  Appetite has been ok. Denies SI.  Has not needed Xanax  prn recently.   Past Psychiatric Medication Trials: Sertraline - Helped initially for  several years at 25 mg and then increased to 50 mg daily. Had affective dulling and fatigue with 75 mg.  Prozac - Helped initially. Was on 20 mg and increased to 30 mg at the start of the pandemic. Dose was increased to 40 mg and now feels exhausted and wants to sleep. No longer seems to be as helpful. Has been more tired on 40 mg with some improvement.  Wellbutrin  XL Klonopin  Adderall XR- took 20 mg in college and at one point was on 20 mg BID Had some anxiety. Vyvanse    Review of Systems:  Review of Systems  Cardiovascular:  Negative for palpitations.  Musculoskeletal:  Negative for gait problem.  Psychiatric/Behavioral:         Please refer to HPI    Medications: I have reviewed the patient's current medications.  Current Outpatient Medications  Medication Sig Dispense Refill   lisdexamfetamine (VYVANSE ) 40 MG capsule Take 1 capsule (40 mg total) by mouth every morning. 30 capsule 0   [START ON 04/26/2023] lisdexamfetamine (VYVANSE ) 40 MG capsule Take 1 capsule (40 mg total) by mouth every morning. 30 capsule 0   [START ON 05/24/2023] lisdexamfetamine (VYVANSE ) 40 MG capsule Take 1 capsule (40 mg total) by mouth every morning. 30 capsule 0   [name] 3-0.02 MG tablet Take 1 tablet by mouth daily.     ALPRAZolam  (XANAX ) 0.5 MG tablet Take 1/2 tab po qd prn panic and severe anxiety 15 tablet 1   Dextromethorphan-buPROPion  ER (AUVELITY ) 45-105 MG TBCR Take 1 tablet by mouth 2 (two) times daily. 180 tablet 1   L-Methylfolate 15 MG TABS Take 1  tablet (15 mg total) by mouth daily at 6 (six) AM. 30 tablet 5   Misc. Devices (ALL-BODY MASSAGE) MISC Full body and paracervical massage 3 times weekly, medically necessary. 1 each 0   sertraline  (ZOLOFT ) 50 MG tablet Take 1 tablet (50 mg total) by mouth daily. 90 tablet 1   triamcinolone  cream (KENALOG ) 0.5 % Apply 1 application topically 2 (two) times daily. To affected areas. (Patient not taking: Reported on 11/22/2020) 30 g 3   No current  facility-administered medications for this visit.    Medication Side Effects: None  Allergies: No Known Allergies  Past Medical History:  Diagnosis Date   Dental crowns present    Difficult intravenous access    Indication for care in labor or delivery 06/25/2015   Interstitial cystitis    Medial meniscus tear 04/23/2014   left knee   Postpartum care following vaginal delivery (4/4) 06/25/2015   Pregnancy with third trimester bleeding, antepartum 06/25/2015   Raynaud's disease     Family History  Problem Relation Age of Onset   Anxiety disorder Mother    ADD / ADHD Mother    Pulmonary embolism Mother    Anxiety disorder Father    Anxiety disorder Sister    ADD / ADHD Brother    Drug abuse Brother    Stroke Maternal Uncle    Deep vein thrombosis Maternal Uncle    Stroke Maternal Grandfather    Stroke Maternal Grandmother    ADD / ADHD Son    ODD Son    Anxiety disorder Son     Social History   Socioeconomic History   Marital status: Married    Spouse name: Not on file   Number of children: Not on file   Years of education: Not on file   Highest education level: Not on file  Occupational History   Not on file  Tobacco Use   Smoking status: Former    Current packs/day: 0.00    Types: Cigarettes    Quit date: 03/23/2007    Years since quitting: 16.0   Smokeless tobacco: Never  Vaping Use   Vaping status: Never Used  Substance and Sexual Activity   Alcohol use: Yes    Comment: occasionally on weekends   Drug use: No   Sexual activity: Not Currently  Other Topics Concern   Not on file  Social History Narrative   Not on file   Social Drivers of Health   Financial Resource Strain: Not on file  Food Insecurity: No Food Insecurity (04/24/2020)   Received from [facility], [facility]   Hunger Vital Sign    Worried About Running Out of Food in the Last Year: Never true    Ran Out of Food in the Last Year: Never true  Transportation Needs: Not on file   Physical Activity: Not on file  Stress: Not on file  Social Connections: Unknown (07/25/2021)   [facility], [facility]   Social Network    Social Network: Not on file  Intimate Partner Violence: Unknown (06/23/2021)   Received from [facility], [facility]   HITS    Physically Hurt: Not on file    Insult or Talk Down To: Not on file    Threaten Physical Harm: Not on file    Scream or Curse: Not on file    Past Medical History, Surgical history, Social history, and Family history were reviewed and updated as appropriate.   Please see review of systems for  further details on the patient's review from today.   Objective:   Physical Exam:  There were no vitals taken for this visit.  Physical Exam Neurological:     Mental Status: She is alert and oriented to person, place, and time.     Cranial Nerves: No dysarthria.  Psychiatric:        Attention and Perception: Attention and perception normal.        Mood and Affect: Mood normal.        Speech: Speech normal.        Behavior: Behavior is cooperative.        Thought Content: Thought content normal. Thought content is not paranoid or delusional. Thought content does not include homicidal or suicidal ideation. Thought content does not include homicidal or suicidal plan.        Cognition and Memory: Cognition and memory normal.        Judgment: Judgment normal.     Comments: Insight intact     Lab Review:     Component Value Date/Time   NA 136 06/25/2015 0805   K 3.5 06/25/2015 0805   CL 107 06/25/2015 0805   CO2 25 06/25/2015 0805   GLUCOSE 76 06/25/2015 0805   BUN 8 06/25/2015 0805   CREATININE 0.64 06/25/2015 0805   CALCIUM 8.5 (L) 06/25/2015 0805   PROT 6.3 (L) 06/25/2015 0805   ALBUMIN 2.8 (L) 06/25/2015 0805   AST 18 06/25/2015 0805   ALT 10 (L) 06/25/2015 0805   ALKPHOS 155 (H) 06/25/2015 0805   BILITOT 0.4 06/25/2015 0805   GFRNONAA >60 06/25/2015 0805   GFRAA >60 06/25/2015 0805        Component Value Date/Time   WBC 11.6 (H) 06/26/2015 0502   RBC 3.59 (L) 06/26/2015 0502   HGB 11.1 (L) 06/26/2015 0502   HCT 32.0 (L) 06/26/2015 0502   PLT 223 06/26/2015 0502   MCV 89.1 06/26/2015 0502   MCH 30.9 06/26/2015 0502   MCHC 34.7 06/26/2015 0502   RDW 13.1 06/26/2015 0502    No results found for: POCLITH, LITHIUM   No results found for: PHENYTOIN, PHENOBARB, VALPROATE, CBMZ   .res Assessment: Plan:   35 minutes spent dedicated to the care of this patient on the date of this encounter to include pre-visit review of records, ordering of medication, post visit documentation, and face-to-face time with the patient discussing recent worsening in anxiety and ADHD. Agree with continuing Sertraline  at 50 mg daily since she notices less irritation at this dose.  Will increase Vyvanse  back to 40 mg daily since she and others have noticed that ADHD symptoms are not fully controlled with 30 mg dose.  Will continue Auvelity  45-105 mg one tablet twice daily for depression. Continue Alprazolam  0.5 mg 1/2 tablet daily as needed for panic and anxiety.  Pt to follow-up in 3 months or sooner if clinically indicated.  Recommend continuing therapy with Holly Ingram, [facility].  Patient advised to contact office with any questions, adverse effects, or acute worsening in signs and symptoms.   Tasneem was seen today for follow-up.  Diagnoses and all orders for this visit:  Attention deficit hyperactivity disorder (ADHD), combined type -     lisdexamfetamine (VYVANSE ) 40 MG capsule; Take 1 capsule (40 mg total) by mouth every morning. -     lisdexamfetamine (VYVANSE ) 40 MG capsule; Take 1 capsule (40 mg total) by mouth every morning. -     lisdexamfetamine (VYVANSE ) 40 MG capsule; Take 1 capsule (  40 mg total) by mouth every morning.  MTHFR mutation -     L-Methylfolate 15 MG TABS; Take 1 tablet (15 mg total) by mouth daily at 6 (six) AM.  PTSD (post-traumatic stress disorder) -      ALPRAZolam  (XANAX ) 0.5 MG tablet; Take 1/2 tab po qd prn panic and severe anxiety -     sertraline  (ZOLOFT ) 50 MG tablet; Take 1 tablet (50 mg total) by mouth daily.  Depression, unspecified depression type -     Dextromethorphan-buPROPion  ER (AUVELITY ) 45-105 MG TBCR; Take 1 tablet by mouth 2 (two) times daily. -     sertraline  (ZOLOFT ) 50 MG tablet; Take 1 tablet (50 mg total) by mouth daily.     Please see After Visit Summary for patient specific instructions.  Future Appointments  Date Time Provider Department Center  04/14/2023  9:00 AM Gail Castilla, [facility] None  05/13/2023  9:00 AM Gail Castilla, [facility] None  06/09/2023  9:00 AM Gail Castilla, [facility] None    No orders of the defined types were placed in this encounter.     -------------------------------

## 2023-04-08 ENCOUNTER — Telehealth: Payer: Self-pay | Admitting: Psychiatry

## 2023-04-08 ENCOUNTER — Other Ambulatory Visit: Payer: Self-pay

## 2023-04-08 DIAGNOSIS — F902 Attention-deficit hyperactivity disorder, combined type: Secondary | ICD-10-CM

## 2023-04-08 MED ORDER — LISDEXAMFETAMINE DIMESYLATE 30 MG PO CAPS: 30.0000 mg | ORAL_CAPSULE | Freq: Every day | ORAL | 0 refills | Status: AC

## 2023-04-08 NOTE — Telephone Encounter (Signed)
Peggy Sanchez was a previous patient of Jessica's. She hasn't picked another provider yet. Requesting to increase her Vyvanse 40 mg to 30 mg as it makes her shakey. Phone number is [phone].

## 2023-04-09 NOTE — Telephone Encounter (Signed)
Pended Rx for 30 mg Vyvanse.

## 2023-04-14 ENCOUNTER — Ambulatory Visit: Payer: No Typology Code available for payment source | Admitting: Psychiatry

## 2023-04-16 ENCOUNTER — Ambulatory Visit: Payer: No Typology Code available for payment source | Admitting: Psychiatry

## 2023-05-13 ENCOUNTER — Ambulatory Visit: Payer: No Typology Code available for payment source | Admitting: Psychiatry

## 2023-05-13 DIAGNOSIS — F902 Attention-deficit hyperactivity disorder, combined type: Secondary | ICD-10-CM | POA: Diagnosis not present

## 2023-05-13 NOTE — Progress Notes (Signed)
[facility] Counselor/Therapist Progress Note  Patient ID: Peggy Sanchez, MRN: [id number],    Date: 05/13/2023  Time Spent: 47 minutes start time 9:08 AM end time 9:55 AM Virtual Visit via Video Note Connected with patient by a telemedicine/telehealth application, with their informed consent, and verified patient privacy and that I am speaking with the correct person using two identifiers. I discussed the limitations, risks, security and privacy concerns of performing psychotherapy and the availability of in person appointments. I also discussed with the patient that there may be a patient responsible charge related to this service. The patient expressed understanding and agreed to proceed. I discussed the treatment planning with the patient. The patient was provided an opportunity to ask questions and all were answered. The patient agreed with the plan and demonstrated an understanding of the instructions. The patient was advised to call  our office if  symptoms worsen or feel they are in a crisis state and need immediate contact.   Therapist Location: office Patient Location: home    Treatment Type: Individual Therapy  Reported Symptoms: Anxiety, focusing issues, difficulties with consistency, rumination  Mental Status Exam:  Appearance:   Casual     Behavior:  Appropriate  Motor:  Normal  Speech/Language:   Normal Rate  Affect:  Appropriate  Mood:  anxious  Thought process:  normal  Thought content:    WNL  Sensory/Perceptual disturbances:    WNL  Orientation:  oriented to person, place, time/date, and situation  Attention:  Good  Concentration:  Good  Memory:  WNL  Fund of knowledge:   Good  Insight:    Good  Judgment:   Good  Impulse Control:  Good   Risk Assessment: Danger to Self:  No Self-injurious Behavior: No Danger to Others: No Duty to Warn:no Physical Aggression / Violence:No  Access to Firearms a concern: No  Gang Involvement:No   Subjective: Met  with patient via virtual session. She shared that she could not get to session due to the weather. She went on to share that her son is involved in an intensive in treatment and that has been very helpful for them. She went on a trip with her ex husband and the children watched the 3 boys.  Patient was able to see that she is doing a lot better overall.  Discussed how she is not having the issues with the PTSD as much and reviewed treatment plan and goals and updated in session.  Discussed trying to develop a treatment plan to make more of her issues.  Had patient do the GAD-7 in session and continue discussing which were her biggest concerns for treatment currently.  Patient shared that her ADHD seems to be the most difficult thing currently.  She explained that it seems to create more anxiety because she gets overwhelmed even in the mornings just getting prepared to go to work.  She was able to recognize she has a hard time getting started but what she does she is able to do what she needs to.  She could also get hyperfocused on one thing rather than doing some other things at her home or at work but she may need to get accomplished.  Agreed that ADHD would be the next thing discussed in worked on in treatment.  Tried to develop a treatment plan and set new goals but we were not able to do so in session and agreed to do that at next session.  Clinician is to send  patient information by Dr. Eino Farber on different subtypes of ADHD.  Discussed the importance of her currently focusing on exercise and diet and trying to take things 1 step at a time.  Interventions: Solution-Oriented/Positive Psychology  Diagnosis:   ICD-10-CM   1. Attention deficit hyperactivity disorder (ADHD), combined type  F90.2       Plan:  Patient is to use CBT and coping skills to decrease triggered responses.  Patient is to practice handouts  on this cognitive distortions and how to untwisting.  Patient is also to remind herself regularly  she is not responsible for other people's emotions.   Patient encouraged to continue watching DBT skill for emotional regulation with her children and practice them herself.  Patient is to remind herself of the truth and figure out ways to simplify her life situations.  Patient is to continue exercising to release negative emotions appropriately.  Patient is to  remind herself to focus on the things that she can control fix and change and work on journaling when she gets very triggered rather than respond  We will develop new treatment plan and goals to meet ADHD diagnosis at next session  Stevphen Meuse, [facility]

## 2023-06-09 ENCOUNTER — Ambulatory Visit: Payer: No Typology Code available for payment source | Admitting: Psychiatry
